# Patient Record
Sex: Male | Born: 1975 | Race: White | Hispanic: No | Marital: Married | Smoking: Current every day smoker
Health system: Southern US, Community
[De-identification: ages and names within clinical notes are randomized; demographics above are authoritative.]

## PROBLEM LIST (undated history)

## (undated) HISTORY — PX: HERNIA REPAIR: SHX51

---

## 2000-04-23 ENCOUNTER — Emergency Department (HOSPITAL_COMMUNITY): Admission: EM | Admit: 2000-04-23 | Discharge: 2000-04-23 | Payer: Self-pay

## 2000-12-15 ENCOUNTER — Emergency Department (HOSPITAL_COMMUNITY): Admission: EM | Admit: 2000-12-15 | Discharge: 2000-12-16 | Payer: Self-pay | Admitting: Emergency Medicine

## 2001-11-05 ENCOUNTER — Emergency Department (HOSPITAL_COMMUNITY): Admission: EM | Admit: 2001-11-05 | Discharge: 2001-11-05 | Payer: Self-pay | Admitting: Emergency Medicine

## 2006-04-06 ENCOUNTER — Emergency Department (HOSPITAL_COMMUNITY): Admission: EM | Admit: 2006-04-06 | Discharge: 2006-04-06 | Payer: Self-pay | Admitting: Emergency Medicine

## 2009-11-26 ENCOUNTER — Ambulatory Visit: Payer: Self-pay | Admitting: Diagnostic Radiology

## 2009-11-26 ENCOUNTER — Emergency Department (HOSPITAL_BASED_OUTPATIENT_CLINIC_OR_DEPARTMENT_OTHER): Admission: EM | Admit: 2009-11-26 | Discharge: 2009-11-26 | Payer: Self-pay | Admitting: Emergency Medicine

## 2014-04-16 ENCOUNTER — Encounter (HOSPITAL_BASED_OUTPATIENT_CLINIC_OR_DEPARTMENT_OTHER): Payer: Self-pay | Admitting: Emergency Medicine

## 2014-04-16 ENCOUNTER — Emergency Department (HOSPITAL_BASED_OUTPATIENT_CLINIC_OR_DEPARTMENT_OTHER): Payer: Self-pay

## 2014-04-16 ENCOUNTER — Inpatient Hospital Stay (HOSPITAL_BASED_OUTPATIENT_CLINIC_OR_DEPARTMENT_OTHER)
Admission: EM | Admit: 2014-04-16 | Discharge: 2014-04-19 | DRG: 872 | Disposition: A | Discharge status: Home / Self Care | Payer: Self-pay | Attending: Internal Medicine | Admitting: Internal Medicine

## 2014-04-16 DIAGNOSIS — R509 Fever, unspecified: Secondary | ICD-10-CM | POA: Diagnosis present

## 2014-04-16 DIAGNOSIS — IMO0002 Reserved for concepts with insufficient information to code with codable children: Secondary | ICD-10-CM | POA: Diagnosis present

## 2014-04-16 DIAGNOSIS — L0390 Cellulitis, unspecified: Secondary | ICD-10-CM

## 2014-04-16 DIAGNOSIS — F1911 Other psychoactive substance abuse, in remission: Secondary | ICD-10-CM | POA: Diagnosis present

## 2014-04-16 DIAGNOSIS — M25529 Pain in unspecified elbow: Secondary | ICD-10-CM | POA: Diagnosis present

## 2014-04-16 DIAGNOSIS — F17200 Nicotine dependence, unspecified, uncomplicated: Secondary | ICD-10-CM | POA: Diagnosis present

## 2014-04-16 DIAGNOSIS — D72829 Elevated white blood cell count, unspecified: Secondary | ICD-10-CM | POA: Diagnosis present

## 2014-04-16 DIAGNOSIS — M25521 Pain in right elbow: Secondary | ICD-10-CM

## 2014-04-16 DIAGNOSIS — L0291 Cutaneous abscess, unspecified: Secondary | ICD-10-CM | POA: Diagnosis present

## 2014-04-16 DIAGNOSIS — A419 Sepsis, unspecified organism: Principal | ICD-10-CM | POA: Diagnosis present

## 2014-04-16 DIAGNOSIS — R6520 Severe sepsis without septic shock: Secondary | ICD-10-CM

## 2014-04-16 LAB — CBC WITH DIFFERENTIAL/PLATELET
Basophils Absolute: 0 10*3/uL (ref 0.0–0.1)
Basophils Relative: 0 % (ref 0–1)
Eosinophils Absolute: 0.1 10*3/uL (ref 0.0–0.7)
Eosinophils Relative: 0 % (ref 0–5)
HEMATOCRIT: 36.8 % — AB (ref 39.0–52.0)
Hemoglobin: 12.7 g/dL — ABNORMAL LOW (ref 13.0–17.0)
LYMPHS ABS: 1.5 10*3/uL (ref 0.7–4.0)
LYMPHS PCT: 12 % (ref 12–46)
MCH: 28.7 pg (ref 26.0–34.0)
MCHC: 34.5 g/dL (ref 30.0–36.0)
MCV: 83.3 fL (ref 78.0–100.0)
Monocytes Absolute: 1.2 10*3/uL — ABNORMAL HIGH (ref 0.1–1.0)
Monocytes Relative: 9 % (ref 3–12)
NEUTROS ABS: 10.5 10*3/uL — AB (ref 1.7–7.7)
Neutrophils Relative %: 79 % — ABNORMAL HIGH (ref 43–77)
PLATELETS: 210 10*3/uL (ref 150–400)
RBC: 4.42 MIL/uL (ref 4.22–5.81)
RDW: 14.7 % (ref 11.5–15.5)
WBC: 13.3 10*3/uL — ABNORMAL HIGH (ref 4.0–10.5)

## 2014-04-16 LAB — BASIC METABOLIC PANEL
ANION GAP: 12 (ref 5–15)
BUN: 13 mg/dL (ref 6–23)
CHLORIDE: 97 meq/L (ref 96–112)
CO2: 28 meq/L (ref 19–32)
CREATININE: 1 mg/dL (ref 0.50–1.35)
Calcium: 9.5 mg/dL (ref 8.4–10.5)
GFR calc Af Amer: >90 mL/min (ref >90)
GLUCOSE: 94 mg/dL (ref 70–99)
Potassium: 4 mEq/L (ref 3.7–5.3)
Sodium: 137 mEq/L (ref 137–147)

## 2014-04-16 MED ORDER — VANCOMYCIN HCL IN DEXTROSE 1-5 GM/200ML-% IV SOLN
1000.0000 mg | Freq: Once | INTRAVENOUS | Status: AC
  Administered 2014-04-16: 1000 mg via INTRAVENOUS
  Filled 2014-04-16: qty 200

## 2014-04-16 MED ORDER — FENTANYL CITRATE 0.05 MG/ML IJ SOLN
100.0000 ug | Freq: Once | INTRAMUSCULAR | Status: AC
  Administered 2014-04-16: 100 ug via INTRAVENOUS
  Filled 2014-04-16: qty 2

## 2014-04-16 NOTE — ED Notes (Signed)
Pt states that his current home has lots of fleas and bed bugs, which he contributes to the scabs and puncture marks on his hands and upper arms, denies iv drug use

## 2014-04-16 NOTE — ED Provider Notes (Signed)
CSN: [id number]     Arrival date & time 04/16/14  2224 History  This chart was scribed for Hanley SeamenJohn L Tiare Rohlman, MD by Nicholos Johnsenise Iheanachor, ED scribe. This patient was seen in room MH01/MH01 and the patient's care was started at 10:53 PM.    Chief Complaint  Patient presents with  . Abscess   The history is provided by the patient. No language interpreter was used.   HPI Comments: Marcus Rice is a 38 y.o. male who presents to the Emergency Department with a gradually worsening, painful, red, warm, draining abscess to right upper arm; onset a few weeks ago. Noted subjective fever for the past 2 days. States it "busted" 2 on its own and the hole in the center has widened. Has been squeezing and draining the area; states every time he does it increases in size. Has been using hydrogen peroxide to clean it. Reports abscesses in the past that have self resolved. Denies illicit IV drug usage. Pain in severe at its worse, exacerbated by movement or palpation.  History reviewed. No pertinent past medical history. Past Surgical History  Procedure Laterality Date  . Hernia repair     No family history on file. History  Substance Use Topics  . Smoking status: Current Every Day Smoker -- 0.50 packs/day    Types: Cigarettes  . Smokeless tobacco: Not on file  . Alcohol Use: No    Review of Systems  Skin: Positive for wound.  All other systems reviewed and are negative.  Allergies  Review of patient's allergies indicates no known allergies.  Home Medications   Prior to Admission medications   Not on File   Triage vitals: BP 151/81  Pulse 125  Temp(Src) 100.2 F (37.9 C) (Oral)  Resp 20  Ht 5\' 11"  (1.803 m)  Wt 155 lb (70.308 kg)  BMI 21.63 kg/m2  SpO2 100%  Physical Exam General: Well-developed, well-nourished male in no acute distress; appearance consistent with age of record HENT: normocephalic; atraumatic Eyes: pupils equal, round and reactive to light; extraocular muscles intact Neck:  supple Heart: regular rate and rhythm; no murmurs Lungs: clear to auscultation bilaterally Abdomen: soft; nondistended; nontender; no masses or hepatosplenomegaly; bowel sounds present Extremities: No deformity; full range of motion; pulses normal Neurologic: Awake, alert and oriented; motor function intact in all extremities and symmetric; no facial droop Skin: Warm and dry. Draining abscess of the right upper arm with erythematous, tender, indurated tissue proximally.  Psychiatric: Normal mood and affect      ED Course  Procedures (including critical care time) DIAGNOSTIC STUDIES: Oxygen Saturation is 100% on room air, normal by my interpretation.    COORDINATION OF CARE: At 10:56 PM: Discussed treatment plan with patient which includes bedside ultrasound, IV fluids, and blood work. Patient agrees.    Pus from draining abscess sent for culture.  Vancomycin and Zosyn administered in the ED.  Dr. Allie Bossierhris Blackman will see in orthopedic surgical consultation later this morning. Dr[facility] Welton Fl[facility]Khan [facility]  CRITICAL CARE Performed by: Paula LibraMOLPUS,Clinton Wahlberg L Total critical care time: 35 minutes Critical care time was exclusive of separately billable procedures and treating other patients. Critical care was necessary to treat or prevent imminent or life-threatening deterioration. Critical care was time spent personally by me on the following activities: development of treatment plan with patient and/or surrogate as well as nursing, discussions with consultants, evaluation of patient's response to treatment, examination of patient, obtaining history from patient or surrogate, ordering  and performing treatments and interventions, ordering and review of laboratory studies, ordering and review of radiographic studies, pulse oximetry and re-evaluation of patient's condition.   MDM   Nursing notes and vitals signs, including pulse oximetry, reviewed.  Summary of this  visit's results, reviewed by myself:  Labs:  Results for orders placed during the hospital encounter of 04/16/14 (from the past 24 hour(s))  CBC WITH DIFFERENTIAL     Status: Abnormal   Collection Time    04/16/14 11:15 PM      Result Value Ref Range   WBC 13.3 (*) 4.0 - 10.5 K/uL   RBC 4.42  4.22 - 5.81 MIL/uL   Hemoglobin 12.7 (*) 13.0 - 17.0 g/dL   HCT 16.1 (*) 09.6 - 04.5 %   MCV 83.3  78.0 - 100.0 fL   MCH 28.7  26.0 - 34.0 pg   MCHC 34.5  30.0 - 36.0 g/dL   RDW 40.9  81.1 - 91.4 %   Platelets 210  150 - 400 K/uL   Neutrophils Relative % 79 (*) 43 - 77 %   Neutro Abs 10.5 (*) 1.7 - 7.7 K/uL   Lymphocytes Relative 12  12 - 46 %   Lymphs Abs 1.5  0.7 - 4.0 K/uL   Monocytes Relative 9  3 - 12 %   Monocytes Absolute 1.2 (*) 0.1 - 1.0 K/uL   Eosinophils Relative 0  0 - 5 %   Eosinophils Absolute 0.1  0.0 - 0.7 K/uL   Basophils Relative 0  0 - 1 %   Basophils Absolute 0.0  0.0 - 0.1 K/uL  BASIC METABOLIC PANEL     Status: None   Collection Time    04/16/14 11:15 PM      Result Value Ref Range   Sodium 137  137 - 147 mEq/L   Potassium 4.0  3.7 - 5.3 mEq/L   Chloride 97  96 - 112 mEq/L   CO2 28  19 - 32 mEq/L   Glucose, Bld 94  70 - 99 mg/dL   BUN 13  6 - 23 mg/dL   Creatinine, Ser 7.82  0.50 - 1.35 mg/dL   Calcium 9.5  8.4 - 95.6 mg/dL   GFR calc non Af Amer >90  >90 mL/min   GFR calc Af Amer >90  >90 mL/min   Anion gap 12  5 - 15  I-STAT CG4 LACTIC ACID, ED     Status: None   Collection Time    04/30/14 12:55 AM      Result Value Ref Range   Lactic Acid, Venous 0.78  0.5 - 2.2 mmol/L    Imaging Studies: [place] Extrem Up Right Ltd  04/30/14   CLINICAL DATA:  Draining wound medial right upper arm.  EXAM: ULTRASOUND RIGHT UPPER EXTREMITY LIMITED  TECHNIQUE: Ultrasound examination of the upper extremity soft tissues was performed in the area of clinical concern.  COMPARISON:  None.  FINDINGS: There is a complex fluid collection in the area of concern which is ill-defined  with intervening foci of increased echogenicity and surrounding hyperemia. This fluid collection measures approximately 7.5 x 1.4 x 4.6 cm.  IMPRESSION: Complex fluid collection medially in the soft tissues of the right upper arm suspicious for ill-defined abscess.   Electronically Signed   By: Roxy Horseman M.D.   On: 04/30/2014 00:06    Final diagnoses:  Cellulitis and abscess    I personally performed the services described in this documentation, which was scribed  in my presence. The recorded information has been reviewed and is accurate.     Hanley Seamen, MD 04/17/14 [phone]

## 2014-04-16 NOTE — ED Notes (Signed)
MD at bedside. 

## 2014-04-16 NOTE — ED Notes (Signed)
Abscess to his right upper arm, painful, red, hot to touch and swollen for a week.

## 2014-04-17 ENCOUNTER — Encounter (HOSPITAL_COMMUNITY): Payer: Self-pay | Admitting: Internal Medicine

## 2014-04-17 ENCOUNTER — Inpatient Hospital Stay (HOSPITAL_COMMUNITY): Payer: Self-pay | Admitting: Anesthesiology

## 2014-04-17 ENCOUNTER — Encounter (HOSPITAL_COMMUNITY): Payer: Self-pay | Admitting: Anesthesiology

## 2014-04-17 ENCOUNTER — Encounter (HOSPITAL_COMMUNITY)
Admission: EM | Disposition: A | Discharge status: Home / Self Care | Payer: Self-pay | Source: Home / Self Care | Attending: Internal Medicine

## 2014-04-17 DIAGNOSIS — R6520 Severe sepsis without septic shock: Secondary | ICD-10-CM

## 2014-04-17 DIAGNOSIS — L0291 Cutaneous abscess, unspecified: Secondary | ICD-10-CM | POA: Diagnosis present

## 2014-04-17 DIAGNOSIS — M25529 Pain in unspecified elbow: Secondary | ICD-10-CM | POA: Diagnosis present

## 2014-04-17 DIAGNOSIS — A419 Sepsis, unspecified organism: Secondary | ICD-10-CM

## 2014-04-17 DIAGNOSIS — D72829 Elevated white blood cell count, unspecified: Secondary | ICD-10-CM

## 2014-04-17 DIAGNOSIS — R509 Fever, unspecified: Secondary | ICD-10-CM

## 2014-04-17 DIAGNOSIS — L0390 Cellulitis, unspecified: Secondary | ICD-10-CM

## 2014-04-17 HISTORY — PX: I&D EXTREMITY: SHX5045

## 2014-04-17 LAB — BASIC METABOLIC PANEL
ANION GAP: 12 (ref 5–15)
BUN: 10 mg/dL (ref 6–23)
CALCIUM: 9.2 mg/dL (ref 8.4–10.5)
CO2: 26 meq/L (ref 19–32)
Chloride: 98 mEq/L (ref 96–112)
Creatinine, Ser: 0.99 mg/dL (ref 0.50–1.35)
GFR calc Af Amer: >90 mL/min (ref >90)
GFR calc non Af Amer: >90 mL/min (ref >90)
Glucose, Bld: 107 mg/dL — ABNORMAL HIGH (ref 70–99)
Potassium: 3.9 mEq/L (ref 3.7–5.3)
SODIUM: 136 meq/L — AB (ref 137–147)

## 2014-04-17 LAB — CBC
HCT: 37.4 % — ABNORMAL LOW (ref 39.0–52.0)
Hemoglobin: 13 g/dL (ref 13.0–17.0)
MCH: 28.4 pg (ref 26.0–34.0)
MCHC: 34.8 g/dL (ref 30.0–36.0)
MCV: 81.7 fL (ref 78.0–100.0)
Platelets: 231 10*3/uL (ref 150–400)
RBC: 4.58 MIL/uL (ref 4.22–5.81)
RDW: 14.7 % (ref 11.5–15.5)
WBC: 13.9 10*3/uL — ABNORMAL HIGH (ref 4.0–10.5)

## 2014-04-17 LAB — I-STAT CG4 LACTIC ACID, ED: LACTIC ACID, VENOUS: 0.78 mmol/L (ref 0.5–2.2)

## 2014-04-17 SURGERY — IRRIGATION AND DEBRIDEMENT EXTREMITY
Anesthesia: General | Site: Arm Upper | Laterality: Right

## 2014-04-17 MED ORDER — OXYCODONE HCL 5 MG PO TABS: 5.0000 mg | ORAL_TABLET | ORAL | Status: DC | PRN

## 2014-04-17 MED ORDER — MIDAZOLAM HCL 2 MG/2ML IJ SOLN
INTRAMUSCULAR | Status: AC
  Filled 2014-04-17: qty 2

## 2014-04-17 MED ORDER — LIDOCAINE HCL (CARDIAC) 20 MG/ML IV SOLN
INTRAVENOUS | Status: AC
  Filled 2014-04-17: qty 5

## 2014-04-17 MED ORDER — HYDROMORPHONE HCL PF 1 MG/ML IJ SOLN
0.2500 mg | INTRAMUSCULAR | Status: DC | PRN
Start: 2014-04-17 — End: 2014-04-17
  Administered 2014-04-17 (×4): 0.5 mg via INTRAVENOUS

## 2014-04-17 MED ORDER — 0.9 % SODIUM CHLORIDE (POUR BTL) OPTIME
TOPICAL | Status: DC | PRN
  Administered 2014-04-17: 1000 mL

## 2014-04-17 MED ORDER — PIPERACILLIN-TAZOBACTAM 3.375 G IVPB 30 MIN
3.3750 g | Freq: Once | INTRAVENOUS | Status: AC
  Administered 2014-04-17: 3.375 g via INTRAVENOUS
  Filled 2014-04-17 (×2): qty 50

## 2014-04-17 MED ORDER — ONDANSETRON HCL 4 MG/2ML IJ SOLN
INTRAMUSCULAR | Status: AC
  Filled 2014-04-17: qty 2

## 2014-04-17 MED ORDER — HYDROMORPHONE HCL PF 1 MG/ML IJ SOLN
INTRAMUSCULAR | Status: AC
  Filled 2014-04-17: qty 1

## 2014-04-17 MED ORDER — FENTANYL CITRATE 0.05 MG/ML IJ SOLN
100.0000 ug | Freq: Once | INTRAMUSCULAR | Status: AC
  Administered 2014-04-17: 100 ug via INTRAVENOUS

## 2014-04-17 MED ORDER — FENTANYL CITRATE 0.05 MG/ML IJ SOLN
INTRAMUSCULAR | Status: AC
  Filled 2014-04-17: qty 5

## 2014-04-17 MED ORDER — HYDROCODONE-ACETAMINOPHEN 5-325 MG PO TABS
1.0000 | ORAL_TABLET | ORAL | Status: DC | PRN
  Administered 2014-04-17 – 2014-04-18 (×2): 2 via ORAL
  Administered 2014-04-18: 1 via ORAL
  Administered 2014-04-19 (×2): 2 via ORAL
  Filled 2014-04-17: qty 2
  Filled 2014-04-17: qty 1
  Filled 2014-04-17 (×3): qty 2

## 2014-04-17 MED ORDER — FENTANYL CITRATE 0.05 MG/ML IJ SOLN
INTRAMUSCULAR | Status: AC
  Filled 2014-04-17: qty 2

## 2014-04-17 MED ORDER — ONDANSETRON HCL 4 MG PO TABS: 4.0000 mg | ORAL_TABLET | Freq: Four times a day (QID) | ORAL | Status: DC | PRN

## 2014-04-17 MED ORDER — LACTATED RINGERS IV SOLN
INTRAVENOUS | Status: DC | PRN
  Administered 2014-04-17: 21:00:00 via INTRAVENOUS

## 2014-04-17 MED ORDER — METHOCARBAMOL 1000 MG/10ML IJ SOLN
500.0000 mg | Freq: Four times a day (QID) | INTRAVENOUS | Status: DC | PRN
  Filled 2014-04-17: qty 5

## 2014-04-17 MED ORDER — PROPOFOL 10 MG/ML IV BOLUS
INTRAVENOUS | Status: AC
  Filled 2014-04-17: qty 20

## 2014-04-17 MED ORDER — IBUPROFEN 800 MG PO TABS: 800.0000 mg | ORAL_TABLET | Freq: Four times a day (QID) | ORAL | Status: DC | PRN

## 2014-04-17 MED ORDER — ONDANSETRON HCL 4 MG/2ML IJ SOLN
4.0000 mg | Freq: Three times a day (TID) | INTRAMUSCULAR | Status: DC | PRN
  Administered 2014-04-17: 4 mg via INTRAVENOUS
  Filled 2014-04-17: qty 2

## 2014-04-17 MED ORDER — PIPERACILLIN-TAZOBACTAM 3.375 G IVPB
3.3750 g | Freq: Three times a day (TID) | INTRAVENOUS | Status: DC
  Administered 2014-04-17 – 2014-04-19 (×6): 3.375 g via INTRAVENOUS
  Filled 2014-04-17 (×8): qty 50

## 2014-04-17 MED ORDER — ACETAMINOPHEN 650 MG RE SUPP: 650.0000 mg | Freq: Four times a day (QID) | RECTAL | Status: DC | PRN

## 2014-04-17 MED ORDER — ACETAMINOPHEN 325 MG PO TABS
650.0000 mg | ORAL_TABLET | Freq: Four times a day (QID) | ORAL | Status: DC | PRN
  Administered 2014-04-17: 650 mg via ORAL
  Filled 2014-04-17: qty 2

## 2014-04-17 MED ORDER — OXYCODONE-ACETAMINOPHEN 5-325 MG PO TABS
1.0000 | ORAL_TABLET | ORAL | Status: DC | PRN
  Administered 2014-04-18: 2 via ORAL
  Filled 2014-04-17: qty 2

## 2014-04-17 MED ORDER — LIDOCAINE HCL (CARDIAC) 20 MG/ML IV SOLN
INTRAVENOUS | Status: DC | PRN
  Administered 2014-04-17: 100 mg via INTRAVENOUS

## 2014-04-17 MED ORDER — SODIUM CHLORIDE 0.9 % IV SOLN: INTRAVENOUS | Status: DC

## 2014-04-17 MED ORDER — ACETAMINOPHEN 325 MG PO TABS
650.0000 mg | ORAL_TABLET | Freq: Once | ORAL | Status: AC
  Administered 2014-04-17: 650 mg via ORAL
  Filled 2014-04-17: qty 2

## 2014-04-17 MED ORDER — METOCLOPRAMIDE HCL 5 MG/ML IJ SOLN: 5.0000 mg | Freq: Three times a day (TID) | INTRAMUSCULAR | Status: DC | PRN

## 2014-04-17 MED ORDER — METHOCARBAMOL 500 MG PO TABS: 500.0000 mg | ORAL_TABLET | Freq: Four times a day (QID) | ORAL | Status: DC | PRN

## 2014-04-17 MED ORDER — HYDROMORPHONE HCL PF 1 MG/ML IJ SOLN
1.0000 mg | INTRAMUSCULAR | Status: DC | PRN
  Administered 2014-04-17 (×2): 1 mg via INTRAVENOUS
  Filled 2014-04-17 (×2): qty 1

## 2014-04-17 MED ORDER — PROMETHAZINE HCL 25 MG/ML IJ SOLN: 6.2500 mg | INTRAMUSCULAR | Status: DC | PRN

## 2014-04-17 MED ORDER — VANCOMYCIN HCL IN DEXTROSE 1-5 GM/200ML-% IV SOLN
1000.0000 mg | Freq: Three times a day (TID) | INTRAVENOUS | Status: DC
  Administered 2014-04-17 – 2014-04-19 (×7): 1000 mg via INTRAVENOUS
  Filled 2014-04-17 (×8): qty 200

## 2014-04-17 MED ORDER — ONDANSETRON HCL 4 MG/2ML IJ SOLN: 4.0000 mg | Freq: Four times a day (QID) | INTRAMUSCULAR | Status: DC | PRN

## 2014-04-17 MED ORDER — MORPHINE SULFATE 2 MG/ML IJ SOLN
1.0000 mg | INTRAMUSCULAR | Status: DC | PRN
  Administered 2014-04-18 (×2): 1 mg via INTRAVENOUS
  Filled 2014-04-17 (×2): qty 1

## 2014-04-17 MED ORDER — MIDAZOLAM HCL 5 MG/5ML IJ SOLN
INTRAMUSCULAR | Status: DC | PRN
  Administered 2014-04-17: 2 mg via INTRAVENOUS

## 2014-04-17 MED ORDER — GABAPENTIN 300 MG PO CAPS
300.0000 mg | ORAL_CAPSULE | Freq: Three times a day (TID) | ORAL | Status: DC | PRN
  Filled 2014-04-17: qty 1

## 2014-04-17 MED ORDER — DIPHENHYDRAMINE HCL 12.5 MG/5ML PO ELIX
12.5000 mg | ORAL_SOLUTION | ORAL | Status: DC | PRN
  Administered 2014-04-18: 25 mg via ORAL
  Filled 2014-04-17: qty 10

## 2014-04-17 MED ORDER — FENTANYL CITRATE 0.05 MG/ML IJ SOLN
INTRAMUSCULAR | Status: DC | PRN
  Administered 2014-04-17: 50 ug via INTRAVENOUS
  Administered 2014-04-17: 25 ug via INTRAVENOUS
  Administered 2014-04-17: 100 ug via INTRAVENOUS
  Administered 2014-04-17: 25 ug via INTRAVENOUS
  Administered 2014-04-17: 50 ug via INTRAVENOUS

## 2014-04-17 MED ORDER — FENTANYL CITRATE 0.05 MG/ML IJ SOLN
INTRAMUSCULAR | Status: AC
  Administered 2014-04-17: 100 ug via INTRAVENOUS
  Filled 2014-04-17: qty 2

## 2014-04-17 MED ORDER — FENTANYL CITRATE 0.05 MG/ML IJ SOLN
25.0000 ug | INTRAMUSCULAR | Status: DC | PRN
  Administered 2014-04-17 (×2): 50 ug via INTRAVENOUS

## 2014-04-17 MED ORDER — ENOXAPARIN SODIUM 40 MG/0.4ML ~~LOC~~ SOLN
40.0000 mg | SUBCUTANEOUS | Status: DC
  Administered 2014-04-18: 40 mg via SUBCUTANEOUS
  Filled 2014-04-17 (×3): qty 0.4

## 2014-04-17 MED ORDER — SODIUM CHLORIDE 0.9 % IV SOLN
INTRAVENOUS | Status: DC
  Administered 2014-04-17 – 2014-04-18 (×2): via INTRAVENOUS

## 2014-04-17 MED ORDER — HYDROCODONE-ACETAMINOPHEN 5-325 MG PO TABS
ORAL_TABLET | ORAL | Status: AC
  Filled 2014-04-17: qty 2

## 2014-04-17 MED ORDER — SODIUM CHLORIDE 0.9 % IR SOLN
Status: DC | PRN
  Administered 2014-04-17: 3000 mL

## 2014-04-17 MED ORDER — METOCLOPRAMIDE HCL 10 MG PO TABS: 5.0000 mg | ORAL_TABLET | Freq: Three times a day (TID) | ORAL | Status: DC | PRN

## 2014-04-17 MED ORDER — KETOROLAC TROMETHAMINE 15 MG/ML IJ SOLN
30.0000 mg | Freq: Four times a day (QID) | INTRAMUSCULAR | Status: DC | PRN
  Administered 2014-04-17 (×2): 30 mg via INTRAVENOUS
  Filled 2014-04-17: qty 2
  Filled 2014-04-17: qty 1
  Filled 2014-04-17 (×2): qty 2

## 2014-04-17 MED ORDER — PROPOFOL 10 MG/ML IV BOLUS
INTRAVENOUS | Status: DC | PRN
  Administered 2014-04-17: 200 mg via INTRAVENOUS
  Administered 2014-04-17: 50 mg via INTRAVENOUS

## 2014-04-17 SURGICAL SUPPLY — 46 items
BAG ZIPLOCK 12X15 (MISCELLANEOUS) IMPLANT
BANDAGE ELASTIC 4 VELCRO ST LF (GAUZE/BANDAGES/DRESSINGS) ×3 IMPLANT
BLADE SURG SZ10 CARB STEEL (BLADE) IMPLANT
BNDG GAUZE ELAST 4 BULKY (GAUZE/BANDAGES/DRESSINGS) IMPLANT
CUFF TOURN SGL QUICK 18 (TOURNIQUET CUFF) IMPLANT
DRAIN PENROSE 18X1/2 LTX STRL (DRAIN) IMPLANT
DRAPE LG THREE QUARTER DISP (DRAPES) ×3 IMPLANT
DRAPE SURG 17X11 SM STRL (DRAPES) IMPLANT
DRAPE U-SHAPE 47X51 STRL (DRAPES) ×3 IMPLANT
DRSG PAD ABDOMINAL 8X10 ST (GAUZE/BANDAGES/DRESSINGS) IMPLANT
DRSG TEGADERM 4X4.75 (GAUZE/BANDAGES/DRESSINGS) ×9 IMPLANT
ELECT REM PT RETURN 9FT ADLT (ELECTROSURGICAL) ×3
ELECTRODE REM PT RTRN 9FT ADLT (ELECTROSURGICAL) ×1 IMPLANT
GAUZE SPONGE 4X4 12PLY STRL (GAUZE/BANDAGES/DRESSINGS) ×2 IMPLANT
GAUZE XEROFORM 1X8 LF (GAUZE/BANDAGES/DRESSINGS) ×3 IMPLANT
GLOVE BIO SURGEON STRL SZ8 (GLOVE) ×6 IMPLANT
GLOVE BIOGEL PI IND STRL 7.5 (GLOVE) ×1 IMPLANT
GLOVE BIOGEL PI IND STRL 8 (GLOVE) ×2 IMPLANT
GLOVE BIOGEL PI INDICATOR 7.5 (GLOVE) ×2
GLOVE BIOGEL PI INDICATOR 8 (GLOVE) ×4
GLOVE SURG SS PI 7.5 STRL IVOR (GLOVE) ×3 IMPLANT
GOWN STRL REUS W/TWL XL LVL3 (GOWN DISPOSABLE) ×9 IMPLANT
HANDPIECE INTERPULSE COAX TIP (DISPOSABLE) ×2
IV NS IRRIG 3000ML ARTHROMATIC (IV SOLUTION) ×3 IMPLANT
KIT BASIN OR (CUSTOM PROCEDURE TRAY) ×3 IMPLANT
MANIFOLD NEPTUNE II (INSTRUMENTS) ×3 IMPLANT
NS IRRIG 1000ML POUR BTL (IV SOLUTION) ×3 IMPLANT
PACK LOWER EXTREMITY WL (CUSTOM PROCEDURE TRAY) ×3 IMPLANT
PAD CAST 4YDX4 CTTN HI CHSV (CAST SUPPLIES) IMPLANT
PADDING CAST COTTON 4X4 STRL (CAST SUPPLIES)
POSITIONER SURGICAL ARM (MISCELLANEOUS) ×3 IMPLANT
SET HNDPC FAN SPRY TIP SCT (DISPOSABLE) ×1 IMPLANT
SHIELD SPLASH 9X12 PIC/PGM (MISCELLANEOUS) ×6 IMPLANT
SOL PREP POV-IOD 4OZ 10% (MISCELLANEOUS) ×3 IMPLANT
SOL PREP PROV IODINE SCRUB 4OZ (MISCELLANEOUS) IMPLANT
SPONGE GAUZE 4X4 12PLY (GAUZE/BANDAGES/DRESSINGS) ×3 IMPLANT
SUT ETHILON 2 0 PS N (SUTURE) ×3 IMPLANT
SUT ETHILON 3 0 PS 1 (SUTURE) ×3 IMPLANT
SUT PROLENE 3 0 PS 2 (SUTURE) ×3 IMPLANT
SUT VIC AB 1 CT1 27 (SUTURE)
SUT VIC AB 1 CT1 27XBRD ANTBC (SUTURE) IMPLANT
SUT VIC AB 2-0 CT1 27 (SUTURE)
SUT VIC AB 2-0 CT1 27XBRD (SUTURE) IMPLANT
SYR CONTROL 10ML LL (SYRINGE) IMPLANT
SYRINGE 20CC LL (MISCELLANEOUS) IMPLANT
TOWEL OR 17X26 10 PK STRL BLUE (TOWEL DISPOSABLE) ×3 IMPLANT

## 2014-04-17 NOTE — Transfer of Care (Signed)
Immediate Anesthesia Transfer of Care Note  Patient: Marcus [name]  Procedure(s) Performed: Procedure(s): IRRIGATION AND DEBRIDEMENT RIGHT ARM (Right)  Patient Location: PACU  Anesthesia Type:General  Level of Consciousness: awake, alert , oriented and patient cooperative  Airway & Oxygen Therapy: Patient Spontanous Breathing and Patient connected to face mask oxygen  Post-op Assessment: Report given to PACU RN, Post -op Vital signs reviewed and stable and Patient moving all extremities  Post vital signs: Reviewed and stable  Complications: No apparent anesthesia complications

## 2014-04-17 NOTE — H&P (Signed)
Marcus [name] is an 38 y.o. male.   Chief Complaint: Fevers, chills, rt arm pain, erythema. 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  He presents with complaints of pain, erythema, and induration of his rt upper extremity, Rt anterior chest wall, and rt neck.  He states that the problem started about 2 weeks ago. He states that when he first noticed the original lesion then it was about the size of a dime and did not seem especially painful or hot.  He states that the lesion stayed about the same for the next week and a half. Then in the last 5 days the area became larger, more erythematous and became tense.  The patient ultimately punctured it and tried to drain it.  When he begain this he noticed that the pus just kept coming back and the area of erythema grew.  On Monday it extended up on top of his shoulder, around to his rt anterior chest and up his neck.  His pain became excrutiating. He states that he has had fevers and chills for the last 3 days.  History reviewed. No pertinent past medical history.  Past Surgical History  Procedure Laterality Date  . Hernia repair      No family history on file. Social History:  reports that he has been smoking Cigarettes.  He has been smoking about 0.50 packs per day. He does not have any smokeless tobacco history on file. He reports that he does not drink alcohol or use illicit drugs.  Allergies: No Known Allergies  No prescriptions prior to admission    Results for orders placed during the hospital encounter of 04/16/14 (from the past 48 hour(s))  CBC WITH DIFFERENTIAL     Status: Abnormal   Collection Time    04/16/14 11:15 PM      Result Value Ref Range   WBC 13.3 (*) 4.0 - 10.5 K/uL   RBC 4.42  4.22 - 5.81 MIL/uL   Hemoglobin 12.7 (*) 13.0 - 17.0 g/dL   HCT 36.8 (*) 39.0 - 52.0 %   MCV 83.3  78.0 - 100.0 fL   MCH 28.7  26.0 - 34.0 pg   MCHC 34.5  30.0 - 36.0 g/dL   RDW 14.7  11.5 - 15.5 %   Platelets 210   150 - 400 K/uL   Neutrophils Relative % 79 (*) 43 - 77 %   Neutro Abs 10.5 (*) 1.7 - 7.7 K/uL   Lymphocytes Relative 12  12 - 46 %   Lymphs Abs 1.5  0.7 - 4.0 K/uL   Monocytes Relative 9  3 - 12 %   Monocytes Absolute 1.2 (*) 0.1 - 1.0 K/uL   Eosinophils Relative 0  0 - 5 %   Eosinophils Absolute 0.1  0.0 - 0.7 K/uL   Basophils Relative 0  0 - 1 %   Basophils Absolute 0.0  0.0 - 0.1 K/uL  BASIC METABOLIC PANEL     Status: None   Collection Time    04/16/14 11:15 PM      Result Value Ref Range   Sodium 137  137 - 147 mEq/L   Potassium 4.0  3.7 - 5.3 mEq/L   Chloride 97  96 - 112 mEq/L   CO2 28  19 - 32 mEq/L   Glucose, Bld 94  70 - 99 mg/dL   BUN 13  6 - 23 mg/dL   Creatinine, Ser 1.00  0.50 - 1.35 mg/dL   Calcium 9.5  8.4 - 10.5 mg/dL   GFR calc non Af Amer >90  >90 mL/min   GFR calc Af Amer >90  >90 mL/min   Comment: (NOTE)     The eGFR has been calculated using the CKD EPI equation.     This calculation has not been validated in all clinical situations.     eGFR's persistently <90 mL/min signify possible Chronic Kidney     Disease.   Anion gap 12  5 - 15  I-STAT CG4 LACTIC ACID, ED     Status: None   Collection Time    04/17/14 12:55 AM      Result Value Ref Range   Lactic Acid, Venous 0.78  0.5 - 2.2 mmol/L   [place] Extrem Up Right Ltd  04/17/2014   CLINICAL DATA:  Draining wound medial right upper arm.  EXAM: ULTRASOUND RIGHT UPPER EXTREMITY LIMITED  TECHNIQUE: Ultrasound examination of the upper extremity soft tissues was performed in the area of clinical concern.  COMPARISON:  None.  FINDINGS: There is a complex fluid collection in the area of concern which is ill-defined with intervening foci of increased echogenicity and surrounding hyperemia. This fluid collection measures approximately 7.5 x 1.4 x 4.6 cm.  IMPRESSION: Complex fluid collection medially in the soft tissues of the right upper arm suspicious for ill-defined abscess.   Electronically Signed   By: Camie Patience  M.D.   On: 04/17/2014 00:06    Review of Systems  Constitutional: Positive for fever, chills and malaise/fatigue.       No appetite.  HENT: Negative for congestion and sore throat.   Eyes: Negative for blurred vision, double vision and redness.  Respiratory: Negative for cough, hemoptysis, sputum production and wheezing.   Cardiovascular: Negative for chest pain, palpitations, orthopnea and leg swelling.  Gastrointestinal: Negative for heartburn, nausea, vomiting, abdominal pain, diarrhea, constipation, blood in stool and melena.  Genitourinary: Negative for dysuria, urgency, frequency and hematuria.  Musculoskeletal: Positive for joint pain and neck pain.  Skin:       Erythema and induration of the right upper extremity, shoulder, neck and rt anterior chest wall.  Neurological: Negative for dizziness, tingling, sensory change, focal weakness and headaches.  Endo/Heme/Allergies: Negative for environmental allergies. Does not bruise/bleed easily.  Psychiatric/Behavioral: Positive for suicidal ideas. Negative for depression and substance abuse.    Blood pressure 124/38, pulse 74, temperature 98.3 F (36.8 C), temperature source Oral, resp. rate 18, height '5\' 10"'  (1.778 m), weight 70.308 kg (155 lb), SpO2 100.00%. Physical Exam  Constitutional: He is oriented to person, place, and time. He appears well-developed and well-nourished. No distress.  HENT:  Head: Normocephalic and atraumatic.  Mouth/Throat: Oropharynx is clear and moist.  Eyes: Conjunctivae and EOM are normal. Pupils are equal, round, and reactive to light. Right eye exhibits no discharge. Left eye exhibits no discharge. No scleral icterus.  Neck: Normal range of motion. No JVD present. No tracheal deviation present. No thyromegaly present.  Cardiovascular: Normal rate, normal heart sounds and intact distal pulses.  Exam reveals no gallop and no friction rub.   No murmur heard. Respiratory: No stridor. No respiratory distress.  He has no wheezes. He has no rales. He exhibits no tenderness.  GI: He exhibits no distension and no mass. There is no tenderness. There is no rebound and no guarding.  Musculoskeletal: He exhibits no edema and no tenderness.  Lymphadenopathy:    He has cervical adenopathy.  Neurological: He is alert and oriented to person, place, and time. He has normal reflexes. He displays normal reflexes. No cranial nerve deficit. He exhibits normal muscle tone.  Skin: Skin is warm and dry. He is not diaphoretic. There is erythema.  Erythema, induration, and warmth to rt upper extremity, shoulder, neck, and rt anterior chest wall.  Psychiatric: He has a normal mood and affect. His behavior is normal. Judgment and thought content normal.     Assessment/Plan 1, Abscess and cellulitis of the right upper extremity, neck, shoulder, and chest wall. IV antibiotics to cover for resistant organisms.  Pt also has had blood cultures drawn.  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  If blood cultures are positive he should have an echocardiogram perfomed to examine his valves for vegetations. 2. Fevers. - IV fluids and IV antibiotics 3. Rt arm pain - Pain control 4. Leukocytosis - IV antibiotics to cover for resistant organisms until results of blood and wound cultures are returned.  Ayush Boulet 04/17/2014, 3:34 AM

## 2014-04-17 NOTE — Progress Notes (Signed)
Nutrition Brief Note  Patient identified on the Malnutrition Screening Tool (MST) Report  Wt Readings from Last 15 Encounters:  04/17/14 155 lb (70.308 kg)  04/17/14 155 lb (70.308 kg)    Body mass index is 22.24 kg/(m^2). Patient meets criteria for Normal weight based on current BMI.   Current diet order is Regular, patient is consuming approximately 100% of meals at this time. Labs and medications reviewed.   Pt reported slight decrease in appetite for past 2-3 weeks d/t life stressors and moving situations. Diet recall indicates pt consuming two balanced meals daily. Endorsed small amount of weight loss, but states weight normally tends to fluctuate between 155-160 lbs. Currently eating well during admit >75%, and tolerating regular diet  No nutrition interventions warranted at this time. If nutrition issues arise, please consult RD.   Lloyd HugerSarah F Malessa Zartman MS RD LDN Clinical Dietitian Pager:[phone]

## 2014-04-17 NOTE — Consult Note (Signed)
Reason for Consult:  Right upper arm abscess Referring Physician:   EDP  CAUY [name] is an 38 y.o. male.  HPI:   38 yo male with a 2 week history of a draining wound on his right upper arm of uncertain etiology.  Denies any specific injury and denies recent IV drug use/abuse (last used 5 years ago).  The wound has been worsening over the past 24 hours.  Was seen at [facility] and transferred her for further treatment given a large fluid collection noted on ultrasound and an obvious abscess.  He understand fully that surgery is indicated given the nature of the drainage.  [facility] and [facility].  History reviewed. No pertinent past medical history.  Past Surgical History  Procedure Laterality Date  . Hernia repair      No family history on file.  Social History:  reports that he has been smoking Cigarettes.  He has been smoking about 0.50 packs per day. He does not have any smokeless tobacco history on file. He reports that he does not drink alcohol or use illicit drugs.  Allergies: No Known Allergies  Medications: I have reviewed the patient's current medications.  Results for orders placed during the hospital encounter of 04/16/14 (from the past 48 hour(s))  CBC WITH DIFFERENTIAL     Status: Abnormal   Collection Time    04/16/14 11:15 PM      Result Value Ref Range   WBC 13.3 (*) 4.0 - 10.5 K/uL   RBC 4.42  4.22 - 5.81 MIL/uL   Hemoglobin 12.7 (*) 13.0 - 17.0 g/dL   HCT 36.8 (*) 39.0 - 52.0 %   MCV 83.3  78.0 - 100.0 fL   MCH 28.7  26.0 - 34.0 pg   MCHC 34.5  30.0 - 36.0 g/dL   RDW 14.7  11.5 - 15.5 %   Platelets 210  150 - 400 K/uL   Neutrophils Relative % 79 (*) 43 - 77 %   Neutro Abs 10.5 (*) 1.7 - 7.7 K/uL   Lymphocytes Relative 12  12 - 46 %   Lymphs Abs 1.5  0.7 - 4.0 K/uL   Monocytes Relative 9  3 - 12 %   Monocytes Absolute 1.2 (*) 0.1 - 1.0 K/uL   Eosinophils Relative 0  0 - 5 %   Eosinophils Absolute 0.1   0.0 - 0.7 K/uL   Basophils Relative 0  0 - 1 %   Basophils Absolute 0.0  0.0 - 0.1 K/uL  BASIC METABOLIC PANEL     Status: None   Collection Time    04/16/14 11:15 PM      Result Value Ref Range   Sodium 137  137 - 147 mEq/L   Potassium 4.0  3.7 - 5.3 mEq/L   Chloride 97  96 - 112 mEq/L   CO2 28  19 - 32 mEq/L   Glucose, Bld 94  70 - 99 mg/dL   BUN 13  6 - 23 mg/dL   Creatinine, Ser 1.00  0.50 - 1.35 mg/dL   Calcium 9.5  8.4 - 10.5 mg/dL   GFR calc non Af Amer >90  >90 mL/min   GFR calc Af Amer >90  >90 mL/min   Comment: (NOTE)     The eGFR has been calculated using the CKD EPI equation.     This calculation has not been validated in all clinical situations.  eGFR's persistently <90 mL/min signify possible Chronic Kidney     Disease.   Anion gap 12  5 - 15  I-STAT CG4 LACTIC ACID, ED     Status: None   Collection Time    04/17/14 12:55 AM      Result Value Ref Range   Lactic Acid, Venous 0.78  0.5 - 2.2 mmol/L  CBC     Status: Abnormal   Collection Time    04/17/14  5:30 AM      Result Value Ref Range   WBC 13.9 (*) 4.0 - 10.5 K/uL   RBC 4.58  4.22 - 5.81 MIL/uL   Hemoglobin 13.0  13.0 - 17.0 g/dL   HCT 37.4 (*) 39.0 - 52.0 %   MCV 81.7  78.0 - 100.0 fL   MCH 28.4  26.0 - 34.0 pg   MCHC 34.8  30.0 - 36.0 g/dL   RDW 14.7  11.5 - 15.5 %   Platelets 231  150 - 400 K/uL  BASIC METABOLIC PANEL     Status: Abnormal   Collection Time    04/17/14  5:30 AM      Result Value Ref Range   Sodium 136 (*) 137 - 147 mEq/L   Potassium 3.9  3.7 - 5.3 mEq/L   Chloride 98  96 - 112 mEq/L   CO2 26  19 - 32 mEq/L   Glucose, Bld 107 (*) 70 - 99 mg/dL   BUN 10  6 - 23 mg/dL   Creatinine, Ser 0.99  0.50 - 1.35 mg/dL   Calcium 9.2  8.4 - 10.5 mg/dL   GFR calc non Af Amer >90  >90 mL/min   GFR calc Af Amer >90  >90 mL/min   Comment: (NOTE)     The eGFR has been calculated using the CKD EPI equation.     This calculation has not been validated in all clinical situations.     eGFR's  persistently <90 mL/min signify possible Chronic Kidney     Disease.   Anion gap 12  5 - 15    [place] Extrem Up Right Ltd  04/17/2014   CLINICAL DATA:  Draining wound medial right upper arm.  EXAM: ULTRASOUND RIGHT UPPER EXTREMITY LIMITED  TECHNIQUE: Ultrasound examination of the upper extremity soft tissues was performed in the area of clinical concern.  COMPARISON:  None.  FINDINGS: There is a complex fluid collection in the area of concern which is ill-defined with intervening foci of increased echogenicity and surrounding hyperemia. This fluid collection measures approximately 7.5 x 1.4 x 4.6 cm.  IMPRESSION: Complex fluid collection medially in the soft tissues of the right upper arm suspicious for ill-defined abscess.   Electronically Signed   By: Camie Patience M.D.   On: 04/17/2014 00:06    ROS Blood pressure 107/71, pulse 75, temperature 98.2 F (36.8 C), temperature source Oral, resp. rate 18, height '5\' 10"'  (1.778 m), weight 70.308 kg (155 lb), SpO2 100.00%. Physical Exam  Musculoskeletal:       Right upper arm: He exhibits tenderness and edema.       Arms:   Assessment/Plan: Right upper arm abscess 1)  I spoke to [name] in length and he understands fully his situation and the need for surgery this evening.  BLACKMAN,CHRISTOPHER Y 04/17/2014, 7:18 AM

## 2014-04-17 NOTE — Progress Notes (Signed)
[facility] PROGRESS NOTE  Marcus [name]:[id number] DOB: 08/03/1976 DOA: 04/16/2014 PCP: No primary provider on file.  Brief summary  Pt si a 38 yr old man who states that he works as a Scientist, forensic. He presents with complaints of pain, erythema, and induration of his rt upper extremity, Rt anterior chest wall, and rt neck. He states that the problem started about 2 weeks ago. He states that when he first noticed the original lesion then it was about the size of a dime and did not seem especially painful or hot. He states that the lesion stayed about the same for the next week and a half. Then in the last 5 days the area became larger, more erythematous and became tense. The patient ultimately punctured it and tried to drain it. When he begain this he noticed that the pus just kept coming back and the area of erythema grew. On Monday it extended up on top of his shoulder, around to his rt anterior chest and up his neck. His pain became excrutiating. He states that he has had fevers and chills for the last 3 days.  He has been started on broad spectrum abx and has a complex fluid collection of the RUE which will be drained by orthopedics later today.     Assessment/Plan  Severe sepsis with fever, tachycardia, hypotension due to abscess and cellulitis of the right upper extremity, neck, shoulder, and chest wall.  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 Magnus Ivan  Procedures:  [place] RUE  Antibiotics:  vanc 7/21 >>  Zosyn 7/21  >>  HPI/Subjective:  Severe pain in RUE and upset his pain medication was discontinued.  Feeling feverish and sweating.     Objective: Filed Vitals:   04/17/14 0141 04/17/14 0157 04/17/14 0244 04/17/14 0630  BP: 110/64 99/80 124/38 107/71  Pulse:  71 74 75  Temp:  99.5 F (37.5 C) 98.3 F (36.8 C) 98.2 F (36.8 C)  TempSrc:  Oral Oral Oral  Resp:  16 18   Height:   5\' 10"  (1.778 m)   Weight:   70.308 kg (155 lb)   SpO2:  98% 100% 100%    Intake/Output Summary (Last 24 hours) at 04/17/14 0836 Last data filed at 04/17/14 0600  Gross per 24 hour  Intake 343.75 ml  Output      0 ml  Net 343.75 ml   Filed Weights   04/16/14 2231 04/17/14 0244  Weight: 70.308 kg (155 lb) 70.308 kg (155 lb)    Exam:   General:  CM, No acute distress  HEENT:  NCAT, MMM  Cardiovascular:  RRR, nl S1, S2 no mrg, 2+ pulses, warm extremities  Respiratory:  CTAB, no increased WOB  Abdomen:   NABS, soft, NT/ND  MSK:   Normal tone and bulk, no LEE  Neuro:  Grossly intact  Skin:  Swelling of RUE with 1cm ulcerated area on medial bicep with purulent drainage.    Data Reviewed: Basic Metabolic Panel:  Recent Labs Lab  04/16/14 2315 04/17/14 0530  NA 137 136*  K 4.0 3.9  CL 97 98  CO2 28 26  GLUCOSE 94 107*  BUN 13 10  CREATININE 1.00 0.99  CALCIUM 9.5 9.2   Liver Function Tests: No results found for this basename: AST, ALT, ALKPHOS, BILITOT, PROT, ALBUMIN,  in the last 168 hours No results found for this basename: LIPASE, AMYLASE,  in the last 168 hours No results found for this basename: AMMONIA,  in the last 168 hours CBC:  Recent Labs Lab 04/16/14 2315 04/17/14 0530  WBC 13.3* 13.9*  NEUTROABS 10.5*  --   HGB 12.7* 13.0  HCT 36.8* 37.4*  MCV 83.3 81.7  PLT 210 231   Cardiac Enzymes: No results found for this basename: CKTOTAL, CKMB, CKMBINDEX, TROPONINI,  in the last 168 hours BNP (last 3 results) No results found for this basename: PROBNP,  in the last 8760  hours CBG: No results found for this basename: GLUCAP,  in the last 168 hours  No results found for this or any previous visit (from the past 240 hour(s)).   Studies: [place] Extrem Up Right Ltd  04/17/2014   CLINICAL DATA:  Draining wound medial right upper arm.  EXAM: ULTRASOUND RIGHT UPPER EXTREMITY LIMITED  TECHNIQUE: Ultrasound examination of the upper extremity soft tissues was performed in the area of clinical concern.  COMPARISON:  None.  FINDINGS: There is a complex fluid collection in the area of concern which is ill-defined with intervening foci of increased echogenicity and surrounding hyperemia. This fluid collection measures approximately 7.5 x 1.4 x 4.6 cm.  IMPRESSION: Complex fluid collection medially in the soft tissues of the right upper arm suspicious for ill-defined abscess.   Electronically Signed   By: Roxy HorsemanBill  Veazey M.D.   On: 04/17/2014 00:06    Scheduled Meds: . enoxaparin (LOVENOX) injection  40 mg Subcutaneous Q24H  . piperacillin-tazobactam (ZOSYN)  IV  3.375 g Intravenous Q8H  . vancomycin  1,000 mg Intravenous Q8H   Continuous Infusions: . sodium chloride 125 mL/hr at 04/17/14 0415    Active Problems:   Abscess and cellulitis   Pain in joint, upper arm   Febrile illness, acute   Leukocytosis, unspecified    Time spent: 30 min    Jamale Spangler, [facility]ACKENZIE  [facility] Pager [phone]. If 7PM-7AM, please contact night-coverage at [email or web address], password [email or web address] 04/17/2014, 8:36 AM  LOS: 1 day

## 2014-04-17 NOTE — Progress Notes (Signed)
MD note states pt positive for suicidal ideations.  Spoke with pt who denies this again, as on admission.  Pt remains not a suicide risk.

## 2014-04-17 NOTE — Progress Notes (Signed)
Received from care link.  VSS, pt ambulating without assistance.  Pt bathed at sink prior to getting in bed.  No apparent distress.  MD notified of arrival, awaiting orders.

## 2014-04-17 NOTE — Anesthesia Preprocedure Evaluation (Addendum)
Anesthesia Evaluation  Patient identified by MRN, date of birth, ID band Patient awake  General Assessment Comment:Abscess and cellulitis  Reviewed: Allergy & Precautions, H&P , NPO status , Patient's Chart, lab work & pertinent test results  Airway Mallampati: II TM Distance: >3 FB Neck ROM: Full    Dental no notable dental hx.    Pulmonary Current Smoker,  breath sounds clear to auscultation  Pulmonary exam normal       Cardiovascular negative cardio ROS  Rhythm:Regular Rate:Normal     Neuro/Psych negative neurological ROS  negative psych ROS   GI/Hepatic negative GI ROS, Neg liver ROS,   Endo/Other  negative endocrine ROS  Renal/GU negative Renal ROS  negative genitourinary   Musculoskeletal negative musculoskeletal ROS (+)   Abdominal   Peds negative pediatric ROS (+)  Hematology negative hematology ROS (+)   Anesthesia Other Findings   Reproductive/Obstetrics negative OB ROS                          Anesthesia Physical Anesthesia Plan  ASA: II  Anesthesia Plan: General   Post-op Pain Management:    Induction: Intravenous  Airway Management Planned: LMA  Additional Equipment:   Intra-op Plan:   Post-operative Plan: Extubation in OR  Informed Consent: I have reviewed the patients History and Physical, chart, labs and discussed the procedure including the risks, benefits and alternatives for the proposed anesthesia with the patient or authorized representative who has indicated his/her understanding and acceptance.   Dental advisory given  Plan Discussed with: CRNA and Surgeon  Anesthesia Plan Comments:         Anesthesia Quick Evaluation

## 2014-04-17 NOTE — Brief Op Note (Signed)
04/16/2014 - 04/17/2014  9:54 PM  PATIENT:  Rae LipsJody S Borum  38 y.o. male  PRE-OPERATIVE DIAGNOSIS:  right arm abcess  POST-OPERATIVE DIAGNOSIS:  right humerus abscess/infection  PROCEDURE:  Procedure(s): IRRIGATION AND DEBRIDEMENT RIGHT ARM (Right)  SURGEON:  Surgeon(s) and Role:    * Kathryne Hitchhristopher Y Saraphina Lauderbaugh, MD - Primary  PHYSICIAN ASSISTANT: Rexene EdisonGil Clark, PA-C  ANESTHESIA:   general  EBL:   50 cc  BLOOD ADMINISTERED:none  DRAINS: none   LOCAL MEDICATIONS USED:  NONE  SPECIMEN:  No Specimen  DISPOSITION OF SPECIMEN:  N/A  COUNTS:  YES  TOURNIQUET:  * No tourniquets in log *  DICTATION: .Other Dictation: Dictation Number [id number]  PLAN OF CARE: Admit to inpatient   PATIENT DISPOSITION:  PACU - hemodynamically stable.   Delay start of Pharmacological VTE agent (>24hrs) due to surgical blood loss or risk of bleeding: no

## 2014-04-17 NOTE — Care Management Note (Addendum)
    Page 1 of 1   04/19/2014     1:08:57 PM CARE MANAGEMENT NOTE 04/19/2014  Patient:  Marcus [name],Marcus [name]   Account Number:  [id number]  Date Initiated:  04/17/2014  Documentation initiated by:  Lanier ClamMAHABIR,Harlan Ervine  Subjective/Objective Assessment:   38 Y/O M ADMITTED Derwood KaplanW/RUE ABSCESS,CELLULITIS.     Action/Plan:   FROM HOME.   Anticipated DC Date:  04/19/2014   Anticipated DC Plan:  HOME/SELF CARE      DC Planning Services  CM consult      Choice offered to / List presented to:             Status of service:  Completed, signed off Medicare Important Message given?   (If response is "NO", the following Medicare IM given date fields will be blank) Date Medicare IM given:   Medicare IM given by:   Date Additional Medicare IM given:   Additional Medicare IM given by:    Discharge Disposition:  HOME/SELF CARE  Per UR Regulation:  Reviewed for med. 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  04/17/14 Olinda Nola RN,BSN NCM [phone] NO ANTICIPATED D/C NEEDS.

## 2014-04-17 NOTE — Progress Notes (Signed)
ANTIBIOTIC CONSULT NOTE - INITIAL  Pharmacy Consult for zosyn/vancomycin Indication: RUE, neck, shoulder and chest wall cellulitis/abscess  No Known Allergies  Patient Measurements: Height: 5\' 10"  (177.8 cm) Weight: 155 lb (70.308 kg) IBW/kg (Calculated) : 73   Vital Signs: Temp: 98.3 F (36.8 C) (07/21 0244) Temp src: Oral (07/21 0244) BP: 124/38 mmHg (07/21 0244) Pulse Rate: 74 (07/21 0244) Intake/Output from previous day: 07/20 0701 - 07/21 0700 In: 125 [I.V.:125] Out: -  Intake/Output from this shift: Total I/O In: 125 [I.V.:125] Out: -   Labs:  Recent Labs  04/16/14 2315  WBC 13.3*  HGB 12.7*  PLT 210  CREATININE 1.00   Estimated Creatinine Clearance: 98.6 ml/min (by C-G formula based on Cr of 1). No results found for this basename: VANCOTROUGH, VANCOPEAK, VANCORANDOM, GENTTROUGH, GENTPEAK, GENTRANDOM, TOBRATROUGH, TOBRAPEAK, TOBRARND, AMIKACINPEAK, AMIKACINTROU, AMIKACIN,  in the last 72 hours   Microbiology: No results found for this or any previous visit (from the past 720 hour(s)).  Medical History: History reviewed. No pertinent past medical history.  Medications:  Scheduled:  . enoxaparin (LOVENOX) injection  40 mg Subcutaneous Q24H  . piperacillin-tazobactam (ZOSYN)  IV  3.375 g Intravenous Q8H  . vancomycin  1,000 mg Intravenous Q8H   Infusions:  . sodium chloride 125 mL/hr at 04/17/14 0415   Assessment: 38 yo admitted with abscess/cellulitis of the RUE, neck, shoulder and chest wall. Vancomycin and Zosyn per Rx.   Goal of Therapy:  Vancomycin trough level 15-20 mcg/ml  Plan:   Vancomycin 1 gm IV q8h  Zosyn 3.375 Gm IV q8h EI infusion  F/U SCr/levels/cultures   Susanne GreenhouseGreen, Blonnie Maske R 04/17/2014,5:10 AM

## 2014-04-18 ENCOUNTER — Encounter (HOSPITAL_COMMUNITY): Payer: Self-pay | Admitting: Orthopaedic Surgery

## 2014-04-18 DIAGNOSIS — A419 Sepsis, unspecified organism: Principal | ICD-10-CM

## 2014-04-18 DIAGNOSIS — R6520 Severe sepsis without septic shock: Secondary | ICD-10-CM

## 2014-04-18 LAB — BASIC METABOLIC PANEL
ANION GAP: 13 (ref 5–15)
BUN: 13 mg/dL (ref 6–23)
CO2: 25 meq/L (ref 19–32)
CREATININE: 0.99 mg/dL (ref 0.50–1.35)
Calcium: 8.5 mg/dL (ref 8.4–10.5)
Chloride: 105 mEq/L (ref 96–112)
GFR calc non Af Amer: >90 mL/min (ref >90)
Glucose, Bld: 93 mg/dL (ref 70–99)
POTASSIUM: 4.6 meq/L (ref 3.7–5.3)
SODIUM: 143 meq/L (ref 137–147)

## 2014-04-18 LAB — VANCOMYCIN, TROUGH: VANCOMYCIN TR: 16.4 ug/mL (ref 10.0–20.0)

## 2014-04-18 MED ORDER — NICOTINE 21 MG/24HR TD PT24
21.0000 mg | MEDICATED_PATCH | Freq: Every day | TRANSDERMAL | Status: DC
  Administered 2014-04-18: 21 mg via TRANSDERMAL
  Filled 2014-04-18 (×2): qty 1

## 2014-04-18 MED ORDER — NICOTINE 14 MG/24HR TD PT24
14.0000 mg | MEDICATED_PATCH | Freq: Every day | TRANSDERMAL | Status: DC
  Administered 2014-04-18: 14 mg via TRANSDERMAL
  Filled 2014-04-18: qty 1

## 2014-04-18 NOTE — Op Note (Signed)
[name]:  [name], [name]                  ACCOUNT NO.:  [id number]  MEDICAL RECORD NO.:  [id number]  LOCATION:  1404                         [facility]  PHYSICIAN:  Vanita PandaChristopher Y. Magnus IvanBlackman, M.D.DATE OF BIRTH:  06-24-1976  DATE OF PROCEDURE:  04/17/2014 DATE OF DISCHARGE:                              OPERATIVE REPORT   PREOPERATIVE DIAGNOSIS:  Right upper arm abscess.  POSTOPERATIVE DIAGNOSIS:  Right upper arm abscess.  PROCEDURE:  Irrigation and debridement of right upper arm abscess with mainly irrigation of soft tissue and only debridement of skin over a small area.  FINDINGS:  Gross purulence in the subcutaneous and subfascial right proximal humerus area with no deep infiltration of the tissues with definite gross purulence found.  There was no necrotic tissue.  Other then margin of skin.  SURGEON:  Doneen Poissonhristopher Melady Chow, MD.  ASSISTING:  Richardean CanalGilbert Clark, PA-C.  ANESTHESIA:  General.  ESTIMATED BLOOD LOSS:  Less than 50 mL.  COMPLICATIONS:  None.  INDICATIONS:  Mr. Marcus Rice is a 38 year old gentleman, who presented to the emergency room yesterday evening after a 2-week history of worsening abscess involving his right arm.  He says this started as a small wound and kept draining, it got worse to the point where he went to the emergency room due to severe pain.  In the emergency room, an ultrasound was obtained that showed a large fluid collection at his proximal humerus.  There was definitely gross purulence draining.  He was admitted to the Medicine Service, started on IV antibiotics and now he presents for definitive irrigation and debridement.  He understands why this is being done.  PROCEDURE DESCRIPTION:  After informed consent was obtained, appropriate right arm was marked.  He was brought to the operating room and placed supine on the operating table with the right arm on a armboard.  General anesthesia was then obtained.  His right arm was prepped and draped  with DuraPrep and sterile drapes.  A time-out was called to identify correct patient and  correct arm.  The proximal 3rd and the proximal humerus was ellipsed out and there was gross purulence underlying this.  It tracked over a wide area of this subcutaneous and subfascial skin.  We made a separate incision near the axilla, retracted there due to indurated skin, and I found just some significant myositis, but no gross purulence in this area.  I was able to undermine and connect the two incisions underneath.  I then used pulsatile lavage, 3 L of normal saline solution to lavage this area out completely.  I then reapproximated the 2 skin incisions with interrupted 3-0 nylon suture.  Xeroform and well-padded sterile dressing was applied.  He was awakened and extubated to recovery room in stable condition.  All final counts were correct.  There were no complications noted. Postoperatively, I believe he should be on antibiotics for at least another 24 or 48 hours IV, therefore, we will transition to oral antibiotics and sending him home.     Vanita Pandahristopher Y. Magnus IvanBlackman, M.D.     CYB/MEDQ  D:  04/17/2014  T:  04/17/2014  Job:  [id number]

## 2014-04-18 NOTE — Discharge Instructions (Signed)
Place a small, thin layer of neosporin ointment daily over your right arm incisions followed by new dry dressing or large bandaids. You can get your incisions wet daily in the shower starting 04/21/14.

## 2014-04-18 NOTE — Progress Notes (Signed)
[facility] PROGRESS NOTE  Marcus Rice WUJ:[id number] DOB: April 11, 1976 DOA: 04/16/2014 PCP: No primary provider on file.  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 He presents with complaints of pain, erythema, and induration of his rt upper extremity, Rt anterior chest wall, and rt neck. He states that the problem started about 2 weeks ago found to have cellulitis, abscess   1. Severe sepsis with fever, tachycardia, hypotension due to abscess and cellulitis of the right upper extremity, neck, shoulder, and chest wall. 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 Leukocytosis due to cellulitis and abscess; cont atx   Diet: regular  Access: PIV  IVF: yes  Proph: lovenox  Code Status: full  Family Communication: patient alone  Disposition Plan: med-surg  Consultants:  Orthopedics, Dr. Magnus IvanBlackman Procedures:  US RUE Antibiotics:  vanc 7/21 >>  Zosyn 7/21 >>  HPI/Subjective: alert  Objective: Filed Vitals:   04/18/14 0511  BP: 94/56  Pulse: 79  Temp: 98.7 F (37.1 C)  Resp: 20    Intake/Output Summary (Last 24 hours) at 04/18/14 1404 Last data filed at 04/18/14 0600  Gross per 24 hour  Intake 2438.75 ml  Output      5 ml  Net 2433.75 ml   Filed Weights   04/16/14 2231 04/17/14 0244  Weight: 70.308 kg (155 lb) 70.308 kg (155 lb)    Exam:   General:  alert  Cardiovascular: s1,s2 rrr  Respiratory: CTA BL  Abdomen: soft, nt,nd   Musculoskeletal: no LE edema   Data Reviewed: Basic Metabolic Panel:  Recent Labs Lab 04/16/14 2315 04/17/14 0530 04/18/14 0410  NA 137 136* 143  K 4.0 3.9 4.6  CL 97 98 105  CO2 28 26  25   GLUCOSE 94 107* 93  BUN 13 10 13   CREATININE 1.00 0.99 0.99  CALCIUM 9.5 9.2 8.5   Liver Function Tests: No results found for this basename: AST, ALT, ALKPHOS, BILITOT, PROT, ALBUMIN,  in the last 168 hours No results found for this basename: LIPASE, AMYLASE,  in the last 168 hours No results found for this basename: AMMONIA,  in the last 168 hours CBC:  Recent Labs Lab 04/16/14 2315 04/17/14 0530  WBC 13.3* 13.9*  NEUTROABS 10.5*  --   HGB 12.7* 13.0  HCT 36.8* 37.4*  MCV 83.3 81.7  PLT 210 231   Cardiac Enzymes: No results found for this basename: CKTOTAL, CKMB, CKMBINDEX, TROPONINI,  in the last 168 hours BNP (last 3 results) No results found for this basename: PROBNP,  in the last 8760 hours CBG: No results found for this basename: GLUCAP,  in the last 168 hours  Recent Results (from the past 240 hour(s))  CULTURE, ROUTINE-ABSCESS     Status: None   Collection Time    04/16/14 10:57 PM      Result Value Ref Range Status   Specimen Description ARM RIGHT UPPER   Final   Special Requests NONE   Final   Gram Stain     Final   Value: MODERATE WBC PRESENT,BOTH PMN AND MONONUCLEAR     NO SQUAMOUS EPITHELIAL CELLS SEEN  RARE GRAM POSITIVE COCCI IN PAIRS     IN CLUSTERS     Performed at [facility]   Culture     Final   Value: NO GROWTH 1 DAY     Performed at [facility]   Report Status PENDING   Incomplete  CULTURE, BLOOD (ROUTINE X 2)     Status: None   Collection Time    04/16/14 11:15 PM      Result Value Ref Range Status   Specimen Description BLOOD LEFT ARM   Final   Special Requests BOTTLES DRAWN AEROBIC AND ANAEROBIC [facility] EACH   Final   Culture  Setup Time     Final   Value: May 01, 2014 08:33     Performed at [facility]   Culture     Final   Value:        BLOOD CULTURE RECEIVED NO GROWTH TO DATE CULTURE WILL BE HELD FOR 5 DAYS BEFORE ISSUING A FINAL NEGATIVE REPORT     Performed at [facility]   Report Status  PENDING   Incomplete  CULTURE, BLOOD (ROUTINE X 2)     Status: None   Collection Time    May 01, 2014  5:30 AM      Result Value Ref Range Status   Specimen Description BLOOD RIGHT HAND   Final   Special Requests BOTTLES DRAWN AEROBIC AND ANAEROBIC [facility] EACH   Final   Culture  Setup Time     Final   Value: 05/01/14 08:33     Performed at [facility]   Culture     Final   Value:        BLOOD CULTURE RECEIVED NO GROWTH TO DATE CULTURE WILL BE HELD FOR 5 DAYS BEFORE ISSUING A FINAL NEGATIVE REPORT     Performed at [facility]   Report Status PENDING   Incomplete     Studies: [place] Extrem Up Right Ltd  May 01, 2014   CLINICAL DATA:  Draining wound medial right upper arm.  EXAM: ULTRASOUND RIGHT UPPER EXTREMITY LIMITED  TECHNIQUE: Ultrasound examination of the upper extremity soft tissues was performed in the area of clinical concern.  COMPARISON:  None.  FINDINGS: There is a complex fluid collection in the area of concern which is ill-defined with intervening foci of increased echogenicity and surrounding hyperemia. This fluid collection measures approximately 7.5 x 1.4 x 4.6 cm.  IMPRESSION: Complex fluid collection medially in the soft tissues of the right upper arm suspicious for ill-defined abscess.   Electronically Signed   By: Roxy Horseman M.D.   On: 05/01/2014 00:06    Scheduled Meds: . enoxaparin (LOVENOX) injection  40 mg Subcutaneous Q24H  . nicotine  14 mg Transdermal Daily  . piperacillin-tazobactam (ZOSYN)  IV  3.375 g Intravenous Q8H  . vancomycin  1,000 mg Intravenous Q8H   Continuous Infusions: . sodium chloride 10 mL/hr at 04/18/14 0756    Active Problems:   Abscess and cellulitis   Pain in joint, upper arm   Febrile illness, acute   Leukocytosis, unspecified   Severe sepsis    Time spent: >35    Esperanza Sheets  [facility] Pager [phone]. If 7PM-7AM, please contact night-coverage at [email or web address], password [facility] 04/18/2014, 2:04 PM  LOS: 2  days

## 2014-04-18 NOTE — Progress Notes (Signed)
ANTIBIOTIC CONSULT NOTE - FOLLOW UP  Pharmacy Consult for Vancomycin and Zosyn Indication: Right upper arm abscess  No Known Allergies  Patient Measurements: Height: 5\' 10"  (177.8 cm) Weight: 155 lb (70.308 kg) IBW/kg (Calculated) : 73  Vital Signs: Temp: 98.7 F (37.1 C) (07/22 0511) Temp src: Oral (07/22 0511) BP: 94/56 mmHg (07/22 0511) Pulse Rate: 79 (07/22 0511) Intake/Output from previous day: 07/21 0701 - 07/22 0700 In: 2678.8 [P.O.:480; I.V.:1448.8; IV Piggyback:750] Out: 5 [Blood:5] Intake/Output from this shift:    Labs:  Recent Labs  04/16/14 2315 04/17/14 0530 04/18/14 0410  WBC 13.3* 13.9*  --   HGB 12.7* 13.0  --   PLT 210 231  --   CREATININE 1.00 0.99 0.99   Estimated Creatinine Clearance: 99.6 ml/min (by C-G formula based on Cr of 0.99).  Recent Labs  04/18/14 0648  VANCOTROUGH 16.4     Microbiology: Recent Results (from the past 720 hour(s))  CULTURE, ROUTINE-ABSCESS     Status: None   Collection Time    04/16/14 10:57 PM      Result Value Ref Range Status   Specimen Description ARM RIGHT UPPER   Final   Special Requests NONE   Final   Gram Stain     Final   Value: MODERATE WBC PRESENT,BOTH PMN AND MONONUCLEAR     NO SQUAMOUS EPITHELIAL CELLS SEEN     RARE GRAM POSITIVE COCCI IN PAIRS     IN CLUSTERS     [facility]   Culture     Final   Value: NO GROWTH 1 DAY     [facility] at [facility]   Report Status PENDING   Incomplete    Anti-infectives   Start     Dose/Rate Route Frequency Ordered Stop   04/17/14 1000  piperacillin-tazobactam (ZOSYN) IVPB 3.375 g     3.375 g 12.5 mL/hr over 240 Minutes Intravenous Every 8 hours 04/17/14 0429     04/17/14 0800  vancomycin (VANCOCIN) IVPB 1000 mg/200 mL premix     1,000 mg 200 mL/hr over 60 Minutes Intravenous Every 8 hours 04/17/14 0429     04/17/14 0030  piperacillin-tazobactam (ZOSYN) IVPB 3.375 g     3.375 g 100 mL/hr over 30 Minutes Intravenous  Once  04/17/14 0020 04/17/14 0136   04/16/14 2315  vancomycin (VANCOCIN) IVPB 1000 mg/200 mL premix     1,000 mg 200 mL/hr over 60 Minutes Intravenous  Once 04/16/14 2300 04/17/14 0104      Assessment: 37 yoM with abscess and cellulitis of the right upper extremity, neck, shoulder, and chest wall. S/p I&D 7/21PM. Pharmacy consulted to dose vancomycin and zosyn.  7/20 >> Vancomycin >> 7/20 >> Zosyn >>  Tmax: Afeb WBC: elevated 13.9 Renal: SCr 0.99, CrCl ~100 ml/min  Microbiology: 7/20 blood x 2: sent 7/20 RUE abscess: NG x 1 day  Drug level / dose changes info: 7/22 VT at 0700 = 16.4 (therapeutic) - continue 1g q8h  Goal of Therapy:  Eradication of infection Vancomycin trough level 15-20 mcg/ml Zosyn dose per renal function  Plan:   Day #3 Vanc/Zosyn  Continue Vancomycin 1g IV q8h  Continue Zosyn 3.375gm IV q8h (4hr extended infusions) Follow up renal function & cultures Ortho plan noted to transition to oral abx, likely doxycycline, on 7/23  Loralee PacasErin Khrystina Bonnes, PharmD, BCPS Pager: [phone] 04/18/2014,8:32 AM

## 2014-04-18 NOTE — Addendum Note (Signed)
Addendum created 04/18/14 1226 by Elisabeth CaraLacey A Corie Allis, CRNA   Modules edited: Anesthesia LDA

## 2014-04-18 NOTE — Anesthesia Postprocedure Evaluation (Signed)
  Anesthesia Post-op Note  Patient: Marcus Rice  Procedure(s) Performed: Procedure(s) (LRB): IRRIGATION AND DEBRIDEMENT RIGHT ARM (Right)  Patient Location: PACU  Anesthesia Type: General  Level of Consciousness: awake and alert   Airway and Oxygen Therapy: Patient Spontanous Breathing  Post-op Pain: mild  Post-op Assessment: Post-op Vital signs reviewed, Patient's Cardiovascular Status Stable, Respiratory Function Stable, Patent Airway and No signs of Nausea or vomiting  Last Vitals:  Filed Vitals:   04/17/14 2257  BP: 115/70  Pulse: 59  Temp: 36.4 C  Resp: 12    Post-op Vital Signs: stable   Complications: No apparent anesthesia complications

## 2014-04-18 NOTE — Progress Notes (Signed)
Patient ID: Marcus Rice, male   DOB: 16-Aug-1976, 38 y.o.   MRN: [id number] Surgery went well last evening.  Already reports some reduced pain.  I did find gross purulence in the subQ and subfascial area of his proximal humerus and decompressed this.  I think he needs [facility] and then discharge to home tomorrow on oral antibiotics to cover most likely staph, such as doxycycline.  Will follow.

## 2014-04-19 LAB — BASIC METABOLIC PANEL
ANION GAP: 14 (ref 5–15)
BUN: 10 mg/dL (ref 6–23)
CHLORIDE: 104 meq/L (ref 96–112)
CO2: 24 meq/L (ref 19–32)
CREATININE: 1.13 mg/dL (ref 0.50–1.35)
Calcium: 9 mg/dL (ref 8.4–10.5)
GFR calc non Af Amer: 80 mL/min — ABNORMAL LOW (ref >90)
Glucose, Bld: 103 mg/dL — ABNORMAL HIGH (ref 70–99)
POTASSIUM: 3.9 meq/L (ref 3.7–5.3)
SODIUM: 142 meq/L (ref 137–147)

## 2014-04-19 MED ORDER — DOXYCYCLINE HYCLATE 100 MG PO TABS: 100.0000 mg | ORAL_TABLET | Freq: Two times a day (BID) | ORAL | Status: DC

## 2014-04-19 MED ORDER — ACETAMINOPHEN 325 MG PO TABS: 650.0000 mg | ORAL_TABLET | Freq: Four times a day (QID) | ORAL | Status: AC | PRN

## 2014-04-19 MED ORDER — NICOTINE 21 MG/24HR TD PT24: 21.0000 mg | MEDICATED_PATCH | Freq: Every day | TRANSDERMAL | Status: AC

## 2014-04-19 MED ORDER — CEPHALEXIN 500 MG PO CAPS: 500.0000 mg | ORAL_CAPSULE | Freq: Four times a day (QID) | ORAL | Status: AC

## 2014-04-19 NOTE — Discharge Summary (Signed)
Physician Discharge Summary  Rae LipsJody S Rennaker ZOX:[id number] DOB: Jul 11, 1976 DOA: 04/16/2014  PCP: No primary provider on file.  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 He presents with complaints of pain, erythema, and induration of his rt upper extremity, Rt anterior chest wall, and rt neck. He states that the problem started about 2 weeks ago found to have cellulitis, abscess   Hospital Course: 1. Severe sepsis with fever, tachycardia, hypotension due to abscess and cellulitis of the right upper extremity, neck, shoulder, and chest wall. Temperature already trending down  -US: Complex fluid collection medially in the soft tissues of the right upper arm suspicious for ill-defined abscess  - sepsis resolved on vanc and zosyn; s/p I&D by ortho on 7/20;  -blood cultures NGTD; wound cultures +staph pend sensitivity; afebrile, clinically improved after I&D; changed to PO atx, and f/u with ortho in 10 days       Procedures:  I&D (i.e. Studies not automatically included, echos, thoracentesis, etc; not x-rays)  Consultations:  Ortho   Discharge Exam: Filed Vitals:   04/19/14 0510  BP: 113/94  Pulse: 66  Temp: 98.3 F (36.8 C)  Resp: 20    General: alert Cardiovascular: s1,s2 rrr Respiratory: CTA BL  Discharge Instructions  Discharge Instructions   Diet - low sodium heart healthy    Complete by:  As directed      Discharge instructions    Complete by:  As  directed   Please follow up with orthopedics in 10 days     Increase activity slowly    Complete by:  As directed             Medication List         acetaminophen 325 MG tablet  Commonly known as:  TYLENOL  Take 2 tablets (650 mg total) by mouth every 6 (six) hours as needed for mild pain (or Fever >/= 101).     cephALEXin 500 MG capsule  Commonly known as:  KEFLEX  Take 1 capsule (500 mg total) by mouth 4 (four) times daily.     doxycycline 100 MG tablet  Commonly known as:  VIBRA-TABS  Take 1 tablet (100 mg total) by mouth 2 (two) times daily.     nicotine 21 mg/24hr patch  Commonly known as:  NICODERM CQ - dosed in mg/24 hours  Place 1 patch (21 mg total) onto the skin daily.       No Known Allergies     Follow-up Information   Follow up with Kathryne HitchBLACKMAN,CHRISTOPHER Y, MD In 2 weeks.   Specialty:  Orthopedic Surgery   Contact information:   [address] [place] [address] [phone]        The results of significant diagnostics from this hospitalization (including imaging, microbiology, ancillary and laboratory) are listed below for reference.    Significant Diagnostic Studies: [facility] Extrem Up Right [facility]  04/17/2014   CLINICAL DATA:  Draining wound medial right upper arm.  EXAM: ULTRASOUND RIGHT UPPER EXTREMITY LIMITED  TECHNIQUE: Ultrasound examination of the upper extremity soft tissues was performed in the area of clinical concern.  COMPARISON:  None.  FINDINGS: There is a complex fluid collection in the area of concern which is ill-defined with intervening foci of increased echogenicity and surrounding hyperemia. This fluid collection measures approximately 7.5 x 1.4 x 4.6 cm.  IMPRESSION: Complex fluid collection medially in the soft tissues of the right upper arm suspicious for ill-defined abscess.   Electronically Signed   By: Roxy Horseman M.D.   On: 04/17/2014 00:06    Microbiology: Recent Results (from the past 240 hour(s))  CULTURE,  ROUTINE-ABSCESS     Status: None   Collection Time    04/16/14 10:57 PM      Result Value Ref Range Status   Specimen Description ARM RIGHT UPPER   Final   Special Requests NONE   Final   Gram Stain     Final   Value: MODERATE WBC PRESENT,BOTH PMN AND MONONUCLEAR     NO SQUAMOUS EPITHELIAL CELLS SEEN     RARE GRAM POSITIVE COCCI IN PAIRS     IN CLUSTERS     Performed at [facility]   Culture     Final   Value: NO GROWTH 1 DAY     Performed at [facility]   Report Status PENDING   Incomplete  CULTURE, BLOOD (ROUTINE X 2)     Status: None   Collection Time    04/16/14 11:15 PM      Result Value Ref Range Status   Specimen Description BLOOD LEFT ARM   Final   Special Requests BOTTLES DRAWN AEROBIC AND ANAEROBIC [facility] EACH   Final   Culture  Setup Time     Final   Value: 04/17/2014 08:33     Performed at [facility]   Culture     Final   Value:        BLOOD CULTURE RECEIVED NO GROWTH TO DATE CULTURE WILL BE HELD FOR 5 DAYS BEFORE ISSUING A FINAL NEGATIVE REPORT     Performed at [facility]   Report Status PENDING   Incomplete  CULTURE, BLOOD (ROUTINE X 2)     Status: None   Collection Time    04/17/14  5:30 AM      Result Value Ref Range Status   Specimen Description BLOOD RIGHT HAND   Final   Special Requests BOTTLES DRAWN AEROBIC AND ANAEROBIC [facility] EACH   Final   Culture  Setup Time     Final   Value: 04/17/2014 08:33     Performed at [facility]   Culture     Final   Value:        BLOOD CULTURE RECEIVED NO GROWTH TO DATE CULTURE WILL BE HELD FOR 5 DAYS BEFORE ISSUING A FINAL NEGATIVE REPORT     Performed at [facility]   Report Status PENDING   Incomplete     Labs: Basic Metabolic Panel:  Recent Labs Lab 04/16/14 2315 04/17/14 0530 04/18/14 0410 04/19/14 0420  NA 137 136* 143 142  K 4.0 3.9 4.6 3.9  CL 97 98 105 104  CO2 28 26 25 24   GLUCOSE 94 107* 93 103*  BUN 13 10 13 10   CREATININE 1.00 0.99 0.99 1.13   CALCIUM 9.5 9.2 8.5 9.0   Liver Function Tests: No results found for  this basename: AST, ALT, ALKPHOS, BILITOT, PROT, ALBUMIN,  in the last 168 hours No results found for this basename: LIPASE, AMYLASE,  in the last 168 hours No results found for this basename: AMMONIA,  in the last 168 hours CBC:  Recent Labs Lab 04/16/14 2315 04/17/14 0530  WBC 13.3* 13.9*  NEUTROABS 10.5*  --   HGB 12.7* 13.0  HCT 36.8* 37.4*  MCV 83.3 81.7  PLT 210 231   Cardiac Enzymes: No results found for this basename: CKTOTAL, CKMB, CKMBINDEX, TROPONINI,  in the last 168 hours BNP: BNP (last 3 results) No results found for this basename: PROBNP,  in the last 8760 hours CBG: No results found for this basename: GLUCAP,  in the last 168 hours     Signed:  Esperanza Sheets  [facility] 04/19/2014, 10:37 AM

## 2014-04-19 NOTE — Progress Notes (Signed)
Patient ID: Marcus LipsJody S Rice, male   DOB: 09/25/1976, 38 y.o.   MRN: [id number] Doing well.  Pain decreased.  Right arm dressing changed.  Bloody drainage only due to loose wound closure secondary to his infection (to be expected).  Can discharge to home from Ortho standpoint on oral antibiotics.

## 2014-04-20 LAB — CULTURE, ROUTINE-ABSCESS

## 2014-04-23 LAB — CULTURE, BLOOD (ROUTINE X 2)
Culture: NO GROWTH
Culture: NO GROWTH

## 2014-12-06 ENCOUNTER — Encounter (HOSPITAL_BASED_OUTPATIENT_CLINIC_OR_DEPARTMENT_OTHER): Payer: Self-pay | Admitting: Emergency Medicine

## 2014-12-06 ENCOUNTER — Emergency Department (HOSPITAL_BASED_OUTPATIENT_CLINIC_OR_DEPARTMENT_OTHER): Payer: Worker's Compensation

## 2014-12-06 DIAGNOSIS — Y9339 Activity, other involving climbing, rappelling and jumping off: Secondary | ICD-10-CM | POA: Insufficient documentation

## 2014-12-06 DIAGNOSIS — Z79899 Other long term (current) drug therapy: Secondary | ICD-10-CM | POA: Insufficient documentation

## 2014-12-06 DIAGNOSIS — S8992XA Unspecified injury of left lower leg, initial encounter: Secondary | ICD-10-CM | POA: Insufficient documentation

## 2014-12-06 DIAGNOSIS — W11XXXA Fall on and from ladder, initial encounter: Secondary | ICD-10-CM | POA: Insufficient documentation

## 2014-12-06 DIAGNOSIS — Z792 Long term (current) use of antibiotics: Secondary | ICD-10-CM | POA: Insufficient documentation

## 2014-12-06 DIAGNOSIS — Y990 Civilian activity done for income or pay: Secondary | ICD-10-CM | POA: Insufficient documentation

## 2014-12-06 DIAGNOSIS — Z720 Tobacco use: Secondary | ICD-10-CM | POA: Insufficient documentation

## 2014-12-06 DIAGNOSIS — S3992XA Unspecified injury of lower back, initial encounter: Secondary | ICD-10-CM | POA: Insufficient documentation

## 2014-12-06 DIAGNOSIS — S8991XA Unspecified injury of right lower leg, initial encounter: Secondary | ICD-10-CM | POA: Insufficient documentation

## 2014-12-06 DIAGNOSIS — Y92832 Beach as the place of occurrence of the external cause: Secondary | ICD-10-CM | POA: Insufficient documentation

## 2014-12-06 NOTE — ED Notes (Signed)
Patient reports he was climbing a ladder 2 weeks ago at work in the rain and fell off the ladder.  Reports he was 15 feet in the air when he fell.  Reports he injured his back and both knees.

## 2014-12-07 ENCOUNTER — Emergency Department (HOSPITAL_BASED_OUTPATIENT_CLINIC_OR_DEPARTMENT_OTHER)
Admission: EM | Admit: 2014-12-07 | Discharge: 2014-12-07 | Disposition: A | Discharge status: Home / Self Care | Payer: Worker's Compensation | Attending: Emergency Medicine | Admitting: Emergency Medicine

## 2014-12-07 ENCOUNTER — Emergency Department (HOSPITAL_BASED_OUTPATIENT_CLINIC_OR_DEPARTMENT_OTHER): Payer: Worker's Compensation

## 2014-12-07 ENCOUNTER — Encounter (HOSPITAL_BASED_OUTPATIENT_CLINIC_OR_DEPARTMENT_OTHER): Payer: Self-pay | Admitting: Emergency Medicine

## 2014-12-07 DIAGNOSIS — W19XXXA Unspecified fall, initial encounter: Secondary | ICD-10-CM

## 2014-12-07 MED ORDER — MELOXICAM 7.5 MG PO TABS: 7.5000 mg | ORAL_TABLET | Freq: Every day | ORAL | Status: DC

## 2014-12-07 NOTE — ED Provider Notes (Signed)
CSN: [id number]     Arrival date & time 12/06/14  2154 History   First MD Initiated Contact with Patient 12/07/14 0143     Chief Complaint  Patient presents with  . Fall     (Consider location/radiation/quality/duration/timing/severity/associated sxs/prior Treatment) Patient is a 39 y.o. male presenting with fall. The history is provided by the patient.  Fall This is a new problem. The current episode started more than 1 week ago (2 weeks ago from a ladder landed on the right foot). The problem occurs constantly. The problem has not changed since onset.Pertinent negatives include no chest pain, no abdominal pain, no headaches and no shortness of breath. Associated symptoms comments: B knee and LBP. Nothing aggravates the symptoms. Treatments tried: ice and nsaids. The treatment provided no relief.    History reviewed. No pertinent past medical history. Past Surgical History  Procedure Laterality Date  . Hernia repair    . I&d extremity Right 04/17/2014    Procedure: IRRIGATION AND DEBRIDEMENT RIGHT ARM;  Surgeon: Kathryne Hitchhristopher Y Blackman, MD;  Location: [facility]S;  Service: Orthopedics;  Laterality: Right;   History reviewed. No pertinent family history. History  Substance Use Topics  . Smoking status: Current Every Day Smoker -- 0.50 packs/day    Types: Cigarettes  . Smokeless tobacco: Not on file  . Alcohol Use: No    Review of Systems  Respiratory: Negative for shortness of breath.   Cardiovascular: Negative for chest pain.  Gastrointestinal: Negative for abdominal pain.  Neurological: Negative for headaches.  All other systems reviewed and are negative.     Allergies  Review of patient's allergies indicates no known allergies.  Home Medications   Prior to Admission medications   Medication Sig Start Date End Date Taking? Authorizing Provider  acetaminophen (TYLENOL) 325 MG tablet Take 2 tablets (650 mg total) by mouth every 6 (six) hours as needed for mild pain (or Fever  >/= 101). 04/19/14   Esperanza SheetsUlugbek N Buriev, MD  cephALEXin (KEFLEX) 500 MG capsule Take 1 capsule (500 mg total) by mouth 4 (four) times daily. 04/19/14   Esperanza SheetsUlugbek N Buriev, MD  doxycycline (VIBRA-TABS) 100 MG tablet Take 1 tablet (100 mg total) by mouth 2 (two) times daily. 04/19/14   Esperanza SheetsUlugbek N Buriev, MD  nicotine (NICODERM CQ - DOSED IN MG/24 HOURS) 21 mg/24hr patch Place 1 patch (21 mg total) onto the skin daily. 04/19/14   Esperanza SheetsUlugbek N Buriev, MD   BP 110/84 mmHg  Pulse 89  Temp(Src) 98.4 F (36.9 C) (Oral)  Resp 16  Ht 5\' 10"  (1.778 m)  Wt 155 lb (70.308 kg)  BMI 22.24 kg/m2  SpO2 98% Physical Exam  Constitutional: He is oriented to person, place, and time. He appears well-developed and well-nourished. No distress.  HENT:  Head: Normocephalic and atraumatic. Head is without raccoon's eyes and without Battle's sign.  Right Ear: No mastoid tenderness. No hemotympanum.  Left Ear: No mastoid tenderness. No hemotympanum.  Mouth/Throat: Oropharynx is clear and moist.  Eyes: Conjunctivae are normal. Pupils are equal, round, and reactive to light.  Neck: Normal range of motion. Neck supple.  No step offs or crepitance of the ctl spine  Cardiovascular: Normal rate and regular rhythm.   Pulmonary/Chest: Effort normal and breath sounds normal. No respiratory distress. He has no wheezes. He has no rales.  Abdominal: Soft. Bowel sounds are normal. There is no tenderness.  Musculoskeletal: Normal range of motion. He exhibits no edema.       Right knee: Normal.  He exhibits normal range of motion, no swelling, no effusion, no ecchymosis, no deformity, no laceration, no erythema, normal alignment, no LCL laxity, normal patellar mobility, no bony tenderness, normal meniscus and no MCL laxity. No tenderness found. No medial joint line, no lateral joint line, no MCL, no LCL and no patellar tendon tenderness noted.       Left knee: Normal. He exhibits normal range of motion, no swelling, no effusion, no ecchymosis,  no deformity, no laceration, no erythema, normal alignment, no LCL laxity, normal patellar mobility, no bony tenderness, normal meniscus and no MCL laxity. No tenderness found. No medial joint line, no lateral joint line, no MCL, no LCL and no patellar tendon tenderness noted.  Intact gait  Neurological: He is alert and oriented to person, place, and time. He has normal reflexes.  Skin: Skin is warm and dry.    ED Course  Procedures (including critical care time) Labs Review Labs Reviewed - No data to display  Imaging Review Dg Lumbar Spine Complete  12/07/2014   CLINICAL DATA:  Patient fell 15 feet off of a ladder about 2 weeks ago. Increasing low back pain over the last few days.  EXAM: LUMBAR SPINE - COMPLETE 4+ VIEW  COMPARISON:  CT abdomen and pelvis 04/06/2006  FINDINGS: There is no evidence of lumbar spine fracture. Alignment is normal. Intervertebral disc spaces are maintained.  IMPRESSION: Negative.   Electronically Signed   By: Burman Nieves M.D.   On: 12/07/2014 00:21     EKG Interpretation None      MDM   Final diagnoses:  Fall    Will need to follow up with [facility] med panel and orthopedics for ongoing care.  Stable gait stable for d/c at this time    Adrea Sherpa, MD 12/07/14 1610

## 2014-12-07 NOTE — ED Notes (Signed)
Pt alert, NAD, calm, interactive, resps e/u, speaking in clear complete sentences. Denies pain at this time. Mentions spasms and numbness in mid back. Mentions fell 2 weeks ago. Has not been seen for same since injury/incident. Describes as, "Larey SeatFell 15' from ladder [place] landed in Agricultural consultantwet concrete. Here for back and bilateral knees.

## 2016-05-19 ENCOUNTER — Encounter (HOSPITAL_COMMUNITY): Payer: Self-pay | Admitting: Emergency Medicine

## 2016-05-19 ENCOUNTER — Emergency Department (HOSPITAL_COMMUNITY)
Admission: EM | Admit: 2016-05-19 | Discharge: 2016-05-19 | Disposition: A | Discharge status: Home / Self Care | Payer: Self-pay | Attending: Emergency Medicine | Admitting: Emergency Medicine

## 2016-05-19 ENCOUNTER — Emergency Department (HOSPITAL_COMMUNITY): Payer: Self-pay

## 2016-05-19 DIAGNOSIS — Z23 Encounter for immunization: Secondary | ICD-10-CM | POA: Insufficient documentation

## 2016-05-19 DIAGNOSIS — M25562 Pain in left knee: Secondary | ICD-10-CM | POA: Insufficient documentation

## 2016-05-19 DIAGNOSIS — Y92410 Unspecified street and highway as the place of occurrence of the external cause: Secondary | ICD-10-CM | POA: Insufficient documentation

## 2016-05-19 DIAGNOSIS — T148XXA Other injury of unspecified body region, initial encounter: Secondary | ICD-10-CM

## 2016-05-19 DIAGNOSIS — Y999 Unspecified external cause status: Secondary | ICD-10-CM | POA: Insufficient documentation

## 2016-05-19 DIAGNOSIS — Y939 Activity, unspecified: Secondary | ICD-10-CM | POA: Insufficient documentation

## 2016-05-19 DIAGNOSIS — Z79899 Other long term (current) drug therapy: Secondary | ICD-10-CM | POA: Insufficient documentation

## 2016-05-19 DIAGNOSIS — S52122A Displaced fracture of head of left radius, initial encounter for closed fracture: Secondary | ICD-10-CM | POA: Insufficient documentation

## 2016-05-19 DIAGNOSIS — F17210 Nicotine dependence, cigarettes, uncomplicated: Secondary | ICD-10-CM | POA: Insufficient documentation

## 2016-05-19 MED ORDER — IBUPROFEN 800 MG PO TABS: 800.0000 mg | ORAL_TABLET | Freq: Three times a day (TID) | ORAL | 0 refills | Status: AC | PRN

## 2016-05-19 MED ORDER — METHOCARBAMOL 500 MG PO TABS: 500.0000 mg | ORAL_TABLET | Freq: Three times a day (TID) | ORAL | 0 refills | Status: AC | PRN

## 2016-05-19 MED ORDER — IBUPROFEN 800 MG PO TABS
800.0000 mg | ORAL_TABLET | Freq: Once | ORAL | Status: AC
  Administered 2016-05-19: 800 mg via ORAL
  Filled 2016-05-19: qty 1

## 2016-05-19 MED ORDER — TETANUS-DIPHTH-ACELL PERTUSSIS 5-2.5-18.5 LF-MCG/0.5 IM SUSP
0.5000 mL | Freq: Once | INTRAMUSCULAR | Status: AC
  Administered 2016-05-19: 0.5 mL via INTRAMUSCULAR
  Filled 2016-05-19: qty 0.5

## 2016-05-19 NOTE — Discharge Instructions (Addendum)
Read the information below.  Use the prescribed medication as directed.  Please discuss all new medications with your pharmacist.  You may return to the Emergency Department at any time for worsening condition or any new symptoms that concern you.     If you develop uncontrolled pain, weakness or numbness of the extremity, severe discoloration of the skin, or you are unable to walk or move your joints, return to the ER for a recheck.
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
## 2016-08-11 ENCOUNTER — Emergency Department (HOSPITAL_COMMUNITY): Payer: Self-pay

## 2016-08-11 ENCOUNTER — Encounter (HOSPITAL_COMMUNITY): Payer: Self-pay | Admitting: Emergency Medicine

## 2016-08-11 ENCOUNTER — Emergency Department (HOSPITAL_COMMUNITY)
Admission: EM | Admit: 2016-08-11 | Discharge: 2016-08-12 | Disposition: A | Discharge status: Home / Self Care | Payer: Self-pay | Attending: Emergency Medicine | Admitting: Emergency Medicine

## 2016-08-11 DIAGNOSIS — F17210 Nicotine dependence, cigarettes, uncomplicated: Secondary | ICD-10-CM | POA: Insufficient documentation

## 2016-08-11 DIAGNOSIS — Z79899 Other long term (current) drug therapy: Secondary | ICD-10-CM | POA: Insufficient documentation

## 2016-08-11 DIAGNOSIS — R109 Unspecified abdominal pain: Secondary | ICD-10-CM | POA: Insufficient documentation

## 2016-08-11 LAB — COMPREHENSIVE METABOLIC PANEL
ALT: 17 U/L (ref 17–63)
ANION GAP: 7 (ref 5–15)
AST: 18 U/L (ref 15–41)
Albumin: 4.2 g/dL (ref 3.5–5.0)
Alkaline Phosphatase: 70 U/L (ref 38–126)
BUN: 21 mg/dL — ABNORMAL HIGH (ref 6–20)
CHLORIDE: 108 mmol/L (ref 101–111)
CO2: 26 mmol/L (ref 22–32)
Calcium: 9.1 mg/dL (ref 8.9–10.3)
Creatinine, Ser: 0.9 mg/dL (ref 0.61–1.24)
GFR calc non Af Amer: >60 mL/min (ref >60)
Glucose, Bld: 98 mg/dL (ref 65–99)
Potassium: 3.8 mmol/L (ref 3.5–5.1)
SODIUM: 141 mmol/L (ref 135–145)
Total Bilirubin: 0.6 mg/dL (ref 0.3–1.2)
Total Protein: 6.9 g/dL (ref 6.5–8.1)

## 2016-08-11 LAB — CBC
HCT: 38.6 % — ABNORMAL LOW (ref 39.0–52.0)
HEMOGLOBIN: 13 g/dL (ref 13.0–17.0)
MCH: 28.8 pg (ref 26.0–34.0)
MCHC: 33.7 g/dL (ref 30.0–36.0)
MCV: 85.6 fL (ref 78.0–100.0)
Platelets: 239 10*3/uL (ref 150–400)
RBC: 4.51 MIL/uL (ref 4.22–5.81)
RDW: 14.2 % (ref 11.5–15.5)
WBC: 8.8 10*3/uL (ref 4.0–10.5)

## 2016-08-11 LAB — URINALYSIS, ROUTINE W REFLEX MICROSCOPIC
Bilirubin Urine: NEGATIVE
GLUCOSE, UA: NEGATIVE mg/dL
HGB URINE DIPSTICK: NEGATIVE
Ketones, ur: NEGATIVE mg/dL
Leukocytes, UA: NEGATIVE
Nitrite: NEGATIVE
Protein, ur: NEGATIVE mg/dL
SPECIFIC GRAVITY, URINE: 1.027 (ref 1.005–1.030)
pH: 7 (ref 5.0–8.0)

## 2016-08-11 LAB — LIPASE, BLOOD: LIPASE: 46 U/L (ref 11–51)

## 2016-08-11 MED ORDER — ACETAMINOPHEN 325 MG PO TABS
650.0000 mg | ORAL_TABLET | Freq: Once | ORAL | Status: AC
  Administered 2016-08-11: 650 mg via ORAL
  Filled 2016-08-11: qty 2

## 2016-08-11 MED ORDER — KETOROLAC TROMETHAMINE 30 MG/ML IJ SOLN
30.0000 mg | Freq: Once | INTRAMUSCULAR | Status: AC
  Administered 2016-08-11: 30 mg via INTRAVENOUS
  Filled 2016-08-11: qty 1

## 2016-08-11 MED ORDER — SODIUM CHLORIDE 0.9 % IV SOLN
INTRAVENOUS | Status: DC
  Administered 2016-08-11: 23:00:00 via INTRAVENOUS

## 2016-08-11 MED ORDER — IOPAMIDOL (ISOVUE-300) INJECTION 61%
30.0000 mL | Freq: Once | INTRAVENOUS | Status: AC | PRN
  Administered 2016-08-11: 15 mL via ORAL

## 2016-08-11 NOTE — ED Notes (Signed)
Patient transported to CT 

## 2016-08-11 NOTE — ED Triage Notes (Signed)
Patient c/o of left sided abd pain that has been on going for years.  Patient states that today pain is more severe and has nausea with me.

## 2016-08-11 NOTE — ED Notes (Signed)
Pt given cup for urine sample 

## 2016-08-11 NOTE — ED Notes (Signed)
Pt reports he is shaking and has now developed a severe headache.  Pt also states that he still has flank pain. Dr. Lynelle DoctorKnapp made aware and gave a verbal order of 650mg  tylenol.

## 2016-08-11 NOTE — ED Provider Notes (Signed)
[facility]-EMERGENCY DEPT Provider Note   CSN: [id number] Arrival date & time: 08/11/16  1757     History   Chief Complaint Chief Complaint  Patient presents with  . Abdominal Pain  . Nausea    HPI Rae LipsJody S Tukes is a 40 y.o. male.  HPI Pt has been having pain in his abdomen for the last 14 years.   Over the last couple of months the pain has increased.  In the last couple of days he has had severe increasing pain.  The pain is on the left side.  It is sharp in nature.  Some nausea. No vomiting.   The pain is pulsatile in nature.  No dysuria.  History reviewed. No pertinent past medical history.  Patient Active Problem List   Diagnosis Date Noted  . Abscess and cellulitis 04/17/2014  . Pain in joint, upper arm 04/17/2014  . Febrile illness, acute 04/17/2014  . Leukocytosis, unspecified 04/17/2014  . Severe sepsis (HCC) 04/17/2014    Past Surgical History:  Procedure Laterality Date  . HERNIA REPAIR    . I&D EXTREMITY Right 04/17/2014   Procedure: IRRIGATION AND DEBRIDEMENT RIGHT ARM;  Surgeon: Kathryne Hitchhristopher Y Blackman, MD;  Location: [facility];  Service: Orthopedics;  Laterality: Right;       Home Medications    Prior to Admission medications   Medication Sig Start Date End Date Taking? Authorizing Provider  acetaminophen (TYLENOL) 325 MG tablet Take 2 tablets (650 mg total) by mouth every 6 (six) hours as needed for mild pain (or Fever >/= 101). 04/19/14   Esperanza SheetsUlugbek N Buriev, MD  cephALEXin (KEFLEX) 500 MG capsule Take 1 capsule (500 mg total) by mouth 4 (four) times daily. 04/19/14   Esperanza SheetsUlugbek N Buriev, MD  doxycycline (VIBRA-TABS) 100 MG tablet Take 1 tablet (100 mg total) by mouth 2 (two) times daily. 04/19/14   Esperanza SheetsUlugbek N Buriev, MD  etodolac (LODINE) 300 MG capsule Take 1 capsule (300 mg total) by mouth every 8 (eight) hours. 08/12/16   Linwood DibblesJon Toua Stites, MD  ibuprofen (ADVIL,MOTRIN) 800 MG tablet Take 1 tablet (800 mg total) by mouth every 8 (eight) hours as needed for mild pain or  moderate pain. 05/19/16   Trixie DredgeEmily West, PA-C  methocarbamol (ROBAXIN) 500 MG tablet Take 1-2 tablets (500-1,000 mg total) by mouth every 8 (eight) hours as needed for muscle spasms (and pain). 05/19/16   Trixie DredgeEmily West, PA-C  nicotine (NICODERM CQ - DOSED IN MG/24 HOURS) 21 mg/24hr patch Place 1 patch (21 mg total) onto the skin daily. 04/19/14   Esperanza SheetsUlugbek N Buriev, MD    Family History No family history on file.  Social History Social History  Substance Use Topics  . Smoking status: Current Every Day Smoker    Packs/day: 0.50    Types: Cigarettes  . Smokeless tobacco: Never Used  . Alcohol use No     Allergies   Patient has no known allergies.   Review of Systems Review of Systems  All other systems reviewed and are negative.    Physical Exam Updated Vital Signs BP 124/74 (BP Location: Left Arm)   Pulse 86   Temp 98.6 F (37 C) (Oral)   Resp 18   SpO2 96%   Physical Exam  Constitutional: He appears well-developed and well-nourished. No distress.  HENT:  Head: Normocephalic and atraumatic.  Right Ear: External ear normal.  Left Ear: External ear normal.  Eyes: Conjunctivae are normal. Right eye exhibits no discharge. Left eye exhibits no discharge. No scleral  icterus.  Neck: Neck supple. No tracheal deviation present.  Cardiovascular: Normal rate, regular rhythm and intact distal pulses.   Pulmonary/Chest: Effort normal and breath sounds normal. No stridor. No respiratory distress. He has no wheezes. He has no rales.  Abdominal: Soft. Bowel sounds are normal. He exhibits no distension. There is no tenderness. There is no rebound and no guarding.  Musculoskeletal: He exhibits no edema or tenderness.  Neurological: He is alert. He has normal strength. No cranial nerve deficit (no facial droop, extraocular movements intact, no slurred speech) or sensory deficit. He exhibits normal muscle tone. He displays no seizure activity. Coordination normal.  Skin: Skin is warm and dry. No  rash noted.  Psychiatric: He has a normal mood and affect.  Nursing note and vitals reviewed.    ED Treatments / Results  Labs (all labs ordered are listed, but only abnormal results are displayed) Labs Reviewed  COMPREHENSIVE METABOLIC PANEL - Abnormal; Notable for the following:       Result Value   BUN 21 (*)    All other components within normal limits  CBC - Abnormal; Notable for the following:    HCT 38.6 (*)    All other components within normal limits  LIPASE, BLOOD  URINALYSIS, ROUTINE W REFLEX MICROSCOPIC (NOT AT [facility])    Radiology Ct Abdomen Pelvis Wo Contrast  Result Date: 08/11/2016 CLINICAL DATA:  Left-sided pain ongoing for years, more severe with nausea today. EXAM: CT ABDOMEN AND PELVIS WITHOUT CONTRAST TECHNIQUE: Multidetector CT imaging of the abdomen and pelvis was performed following the standard protocol without IV contrast. COMPARISON:  Seven hundred [place] 2007 FINDINGS: Lower chest: Bibasilar dependent atelectasis. Normal visualized cardiac chambers size. No significant pericardial effusion. Hepatobiliary: No focal liver abnormality is seen. Mild prominence of the left hepatic lobe as before draped over the spleen. No gallstones, gallbladder wall thickening, or biliary dilatation. What is believed be the gallbladder appears diminutive and contracted. Pancreas: Unremarkable. No pancreatic ductal dilatation or surrounding inflammatory changes. Spleen: Normal in size without focal abnormality. Adrenals/Urinary Tract: Normal bilateral adrenal glands. There are nonobstructing calculi in both kidneys measuring up to 2 mm in the interpolar aspect bilaterally as well as right lower pole. Small extrarenal pelves are seen bilaterally. No hydroureteronephrosis. No bladder calcifications. Pelvic phleboliths are seen in the pelvis bilaterally. Stomach/Bowel: The stomach is distended with fluid and contrast. There is normal small bowel rotation. No acute inflammation nor bowel  obstruction noted. Vascular/Lymphatic: No significant vascular findings are present. No enlarged abdominal or pelvic lymph nodes. Reproductive: Prostate is unremarkable. Other: No abdominal wall hernia or abnormality. No abdominopelvic ascites. Musculoskeletal: No acute or significant osseous findings. IMPRESSION: Nonobstructing bilateral renal calculi otherwise negative exam. No findings for the patient's left-sided pain. Electronically Signed   By: Tollie Eth M.D.   On: 08/11/2016 23:39    Procedures Procedures (including critical care time)  Medications Ordered in ED Medications  0.9 %  sodium chloride infusion ( Intravenous New Bag/Given 08/11/16 2308)  ketorolac (TORADOL) 30 MG/ML injection 30 mg (30 mg Intravenous Given 08/11/16 2308)  iopamidol (ISOVUE-300) 61 % injection 30 mL (15 mLs Oral Contrast Given 08/11/16 2250)  acetaminophen (TYLENOL) tablet 650 mg (650 mg Oral Given 08/11/16 2343)     Initial Impression / Assessment and Plan / ED Course  I have reviewed the triage vital signs and the nursing notes.  Pertinent labs & imaging results that were available during my care of the patient were reviewed by me and considered  in my medical decision making (see chart for details).  Clinical Course     No acute findings on CT scan.  Labs are reassuring.  Etiology of his pain is unclear.  Will dc home with nsaids.  Follow up with [facility]  Final Clinical Impressions(s) / ED Diagnoses   Final diagnoses:  Flank pain    New Prescriptions New Prescriptions   ETODOLAC (LODINE) 300 MG CAPSULE    Take 1 capsule (300 mg total) by mouth every 8 (eight) hours.     Linwood DibblesJon Ravan Schlemmer, MD 08/12/16 [phone]

## 2016-08-12 MED ORDER — HYDROXYZINE HCL 25 MG PO TABS
50.0000 mg | ORAL_TABLET | Freq: Once | ORAL | Status: AC
  Administered 2016-08-12: 50 mg via ORAL
  Filled 2016-08-12: qty 2

## 2016-08-12 MED ORDER — ETODOLAC 300 MG PO CAPS: 300.0000 mg | ORAL_CAPSULE | Freq: Three times a day (TID) | ORAL | 0 refills | Status: AC

## 2016-08-12 NOTE — Discharge Instructions (Signed)
Follow-up with a primary care doctor for further evaluation. Laboratory tests and the CT scan tonight did not show any signs of obstruction or other acute abnormality or infection. Take the medications hasn't as needed for pain.

## 2016-08-17 ENCOUNTER — Emergency Department (HOSPITAL_COMMUNITY)
Admission: EM | Admit: 2016-08-17 | Discharge: 2016-08-17 | Disposition: A | Discharge status: Home / Self Care | Payer: Self-pay | Attending: Emergency Medicine | Admitting: Emergency Medicine

## 2016-08-17 ENCOUNTER — Encounter (HOSPITAL_COMMUNITY): Payer: Self-pay | Admitting: Emergency Medicine

## 2016-08-17 DIAGNOSIS — R55 Syncope and collapse: Secondary | ICD-10-CM | POA: Insufficient documentation

## 2016-08-17 DIAGNOSIS — F17210 Nicotine dependence, cigarettes, uncomplicated: Secondary | ICD-10-CM | POA: Insufficient documentation

## 2016-08-17 DIAGNOSIS — Z79899 Other long term (current) drug therapy: Secondary | ICD-10-CM | POA: Insufficient documentation

## 2016-08-17 LAB — CBC
HEMATOCRIT: 42.9 % (ref 39.0–52.0)
HEMOGLOBIN: 14.2 g/dL (ref 13.0–17.0)
MCH: 28.6 pg (ref 26.0–34.0)
MCHC: 33.1 g/dL (ref 30.0–36.0)
MCV: 86.5 fL (ref 78.0–100.0)
Platelets: 231 10*3/uL (ref 150–400)
RBC: 4.96 MIL/uL (ref 4.22–5.81)
RDW: 13.8 % (ref 11.5–15.5)
WBC: 6.7 10*3/uL (ref 4.0–10.5)

## 2016-08-17 LAB — BASIC METABOLIC PANEL
ANION GAP: 11 (ref 5–15)
BUN: 14 mg/dL (ref 6–20)
CO2: 24 mmol/L (ref 22–32)
Calcium: 9.6 mg/dL (ref 8.9–10.3)
Chloride: 105 mmol/L (ref 101–111)
Creatinine, Ser: 0.88 mg/dL (ref 0.61–1.24)
GFR calc Af Amer: >60 mL/min (ref >60)
GFR calc non Af Amer: >60 mL/min (ref >60)
GLUCOSE: 95 mg/dL (ref 65–99)
POTASSIUM: 5.1 mmol/L (ref 3.5–5.1)
Sodium: 140 mmol/L (ref 135–145)

## 2016-08-17 NOTE — ED Notes (Signed)
EDP at bedside  

## 2016-08-17 NOTE — ED Notes (Signed)
Pt stsates he does weekends in jail and he doesn't eat or drink much during the weekends.

## 2016-08-17 NOTE — ED Notes (Signed)
Pt given a bag lunch with water to drink.

## 2016-08-17 NOTE — ED Provider Notes (Signed)
[facility]PT Provider Note   CSN: [id number] Arrival date & time: 08/17/16  [id number]     History   Chief Complaint Chief Complaint  Patient presents with  . Loss of Consciousness    HPI Marcus Rice is a 40 y.o. male who presents emergency Department with chief complaint of syncope. 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 He got into his Body is seen with respirator and got lightheaded and passed out. He did not injure himself from falling. Patient woke up and realized what happened the sand blast containment area. He states he went outside and told his coworkers what happened, and as he was standing up, became lightheaded again and passed out. His coworkers caught him and sat him in a chair. He had no seizure-like activity. Patient states that he is currently serving (time in jail and he is only allowed 4 ounces of fluid per me. All. He also states that he generally doesn't eat very much during the weekend and feels he was probably very dehydrated. Prior to this morning. He denies any blood in his stools. Has a past history of heroin abuse. States he's been clean and sober for several months. He denies fevers, chills, chest pain, racing or skipping in his heart, nausea, vomiting.  HPI  History reviewed. No pertinent past medical history.  Patient Active Problem List   Diagnosis Date Noted  . Abscess and cellulitis 04/17/2014  . Pain in joint, upper arm 04/17/2014  . Febrile illness, acute 04/17/2014  . Leukocytosis, unspecified 04/17/2014  . Severe sepsis (HCC) 04/17/2014    Past Surgical History:  Procedure Laterality Date  . HERNIA REPAIR    . I&D EXTREMITY Right 04/17/2014   Procedure: IRRIGATION AND DEBRIDEMENT RIGHT ARM;  Surgeon: Kathryne Hitchhristopher Y Blackman, MD;  Location: [facility];  Service: Orthopedics;  Laterality: Right;       Home Medications    Prior to Admission medications   Medication Sig Start Date End Date  Taking? Authorizing Provider  acetaminophen (TYLENOL) 325 MG tablet Take 2 tablets (650 mg total) by mouth every 6 (six) hours as needed for mild pain (or Fever >/= 101). 04/19/14   Esperanza SheetsUlugbek N Buriev, MD  cephALEXin (KEFLEX) 500 MG capsule Take 1 capsule (500 mg total) by mouth 4 (four) times daily. 04/19/14   Esperanza SheetsUlugbek N Buriev, MD  doxycycline (VIBRA-TABS) 100 MG tablet Take 1 tablet (100 mg total) by mouth 2 (two) times daily. 04/19/14   Esperanza SheetsUlugbek N Buriev, MD  etodolac (LODINE) 300 MG capsule Take 1 capsule (300 mg total) by mouth every 8 (eight) hours. 08/12/16   Linwood DibblesJon Knapp, MD  ibuprofen (ADVIL,MOTRIN) 800 MG tablet Take 1 tablet (800 mg total) by mouth every 8 (eight) hours as needed for mild pain or moderate pain. 05/19/16   Trixie DredgeEmily West, PA-C  methocarbamol (ROBAXIN) 500 MG tablet Take 1-2 tablets (500-1,000 mg total) by mouth every 8 (eight) hours as needed for muscle spasms (and pain). 05/19/16   Trixie DredgeEmily West, PA-C  nicotine (NICODERM CQ - DOSED IN MG/24 HOURS) 21 mg/24hr patch Place 1 patch (21 mg total) onto the skin daily. 04/19/14   Esperanza SheetsUlugbek N Buriev, MD    Family History No family history on file.  Social History Social History  Substance Use Topics  . Smoking status: Current Every Day Smoker    Packs/day: 0.50    Types: Cigarettes  . Smokeless tobacco: Never Used  . Alcohol use No  Allergies   Patient has no known allergies.   Review of Systems Review of Systems  Ten systems reviewed and are negative for acute change, except as noted in the HPI.   Physical Exam Updated Vital Signs BP 110/78   Pulse 63   Temp 98.6 F (37 C) (Oral)   Resp 14   Ht 5\' 11"  (1.803 m)   Wt 70.3 kg   SpO2 98%   BMI 21.62 kg/m   Physical Exam  Constitutional: He is oriented to person, place, and time. He appears well-developed and well-nourished. No distress.  HENT:  Head: Normocephalic and atraumatic.  Eyes: Conjunctivae are normal. No scleral icterus.  Neck: Normal range of motion. Neck  supple.  Cardiovascular: Normal rate, regular rhythm and normal heart sounds.   Pulmonary/Chest: Effort normal and breath sounds normal. No respiratory distress.  Abdominal: Soft. There is no tenderness.  Musculoskeletal: He exhibits no edema.  Neurological: He is alert and oriented to person, place, and time.  Speech is clear and goal oriented, follows commands Major Cranial nerves without deficit, no facial droop Normal strength in upper and lower extremities bilaterally including dorsiflexion and plantar flexion, strong and equal grip strength Sensation normal to light and sharp touch Moves extremities without ataxia, coordination intact Normal finger to nose and rapid alternating movements Neg romberg, no pronator drift Normal gait Normal heel-shin and balance   Skin: Skin is warm and dry. He is not diaphoretic.  Psychiatric: His behavior is normal.  Nursing note and vitals reviewed.    ED Treatments / Results  Labs (all labs ordered are listed, but only abnormal results are displayed) Labs Reviewed  BASIC METABOLIC PANEL  CBC  URINALYSIS, ROUTINE W REFLEX MICROSCOPIC (NOT AT [facility])  CBG MONITORING, ED    EKG  EKG Interpretation None       Radiology No results found.  Procedures Procedures (including critical care time)  Medications Ordered in ED Medications - No data to display   Initial Impression / Assessment and Plan / ED Course  I have reviewed the triage vital signs and the nursing notes.  Pertinent labs & imaging results that were available during my care of the patient were reviewed by me and considered in my medical decision making (see chart for details).  Clinical Course as of Aug 17 2010  Mon Aug 17, 2016  1256 Patient blood work without abnormality. EKG is normal. At arrival, but have improved. Patient drank 1 L of fluid. He ate. He is ambulatory without dizziness. Appears safe for discharge at this time.  [AH]    Clinical Course User  Index [AH] Arthor CaptainAbigail Brandey Vandalen, PA-C      Final Clinical Impressions(s) / ED Diagnoses   Final diagnoses:  None    New Prescriptions New Prescriptions   No medications on file     Arthor Captainbigail Bernardina Cacho, PA-C 08/17/16 2011    Bethann BerkshireJoseph Zammit, MD 08/18/16 1511

## 2016-08-17 NOTE — Discharge Instructions (Signed)
Get help right away if: °You have a severe headache. °You have unusual pain in your chest, abdomen, or back. °You are bleeding from your mouth or rectum, or you have black or tarry stool. °You have a very fast or irregular heartbeat (palpitations). °You have pain with breathing. °You faint once or repeatedly. °You have a seizure. °You are confused. °You have trouble walking. °You have severe weakness. °You have vision problems. °

## 2016-08-17 NOTE — ED Triage Notes (Signed)
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 Pt states he got up and went and sat down and drink some coffee and when he stood back up he states he had another syncope episodes.

## 2016-08-17 NOTE — ED Notes (Signed)
Pt ambulated with no difficulty

## 2016-08-21 ENCOUNTER — Encounter (HOSPITAL_COMMUNITY): Payer: Self-pay | Admitting: Nurse Practitioner

## 2016-08-21 ENCOUNTER — Emergency Department (HOSPITAL_COMMUNITY)
Admission: EM | Admit: 2016-08-21 | Discharge: 2016-08-21 | Disposition: A | Discharge status: Home / Self Care | Payer: No Typology Code available for payment source | Attending: Emergency Medicine | Admitting: Emergency Medicine

## 2016-08-21 DIAGNOSIS — R55 Syncope and collapse: Secondary | ICD-10-CM | POA: Insufficient documentation

## 2016-08-21 DIAGNOSIS — F17210 Nicotine dependence, cigarettes, uncomplicated: Secondary | ICD-10-CM | POA: Insufficient documentation

## 2016-08-21 DIAGNOSIS — Y999 Unspecified external cause status: Secondary | ICD-10-CM | POA: Diagnosis not present

## 2016-08-21 DIAGNOSIS — R51 Headache: Secondary | ICD-10-CM | POA: Diagnosis not present

## 2016-08-21 DIAGNOSIS — Z5321 Procedure and treatment not carried out due to patient leaving prior to being seen by health care provider: Secondary | ICD-10-CM | POA: Diagnosis not present

## 2016-08-21 DIAGNOSIS — M25512 Pain in left shoulder: Secondary | ICD-10-CM | POA: Insufficient documentation

## 2016-08-21 DIAGNOSIS — Y9389 Activity, other specified: Secondary | ICD-10-CM | POA: Diagnosis not present

## 2016-08-21 DIAGNOSIS — Y92480 Sidewalk as the place of occurrence of the external cause: Secondary | ICD-10-CM | POA: Diagnosis not present

## 2016-08-21 DIAGNOSIS — W1830XA Fall on same level, unspecified, initial encounter: Secondary | ICD-10-CM | POA: Insufficient documentation

## 2016-08-21 LAB — URINALYSIS, ROUTINE W REFLEX MICROSCOPIC
BILIRUBIN URINE: NEGATIVE
GLUCOSE, UA: NEGATIVE mg/dL
HGB URINE DIPSTICK: NEGATIVE
Ketones, ur: NEGATIVE mg/dL
Leukocytes, UA: NEGATIVE
Nitrite: NEGATIVE
PH: 8 (ref 5.0–8.0)
Protein, ur: NEGATIVE mg/dL
SPECIFIC GRAVITY, URINE: 1.019 (ref 1.005–1.030)

## 2016-08-21 LAB — CBC
HEMATOCRIT: 37.3 % — AB (ref 39.0–52.0)
HEMOGLOBIN: 12.8 g/dL — AB (ref 13.0–17.0)
MCH: 29.1 pg (ref 26.0–34.0)
MCHC: 34.3 g/dL (ref 30.0–36.0)
MCV: 84.8 fL (ref 78.0–100.0)
Platelets: 250 10*3/uL (ref 150–400)
RBC: 4.4 MIL/uL (ref 4.22–5.81)
RDW: 13.6 % (ref 11.5–15.5)
WBC: 8.5 10*3/uL (ref 4.0–10.5)

## 2016-08-21 LAB — BASIC METABOLIC PANEL
ANION GAP: 10 (ref 5–15)
BUN: 17 mg/dL (ref 6–20)
CALCIUM: 9.2 mg/dL (ref 8.9–10.3)
CO2: 25 mmol/L (ref 22–32)
Chloride: 103 mmol/L (ref 101–111)
Creatinine, Ser: 0.98 mg/dL (ref 0.61–1.24)
GFR calc Af Amer: >60 mL/min (ref >60)
GFR calc non Af Amer: >60 mL/min (ref >60)
GLUCOSE: 89 mg/dL (ref 65–99)
POTASSIUM: 4 mmol/L (ref 3.5–5.1)
Sodium: 138 mmol/L (ref 135–145)

## 2016-08-21 NOTE — ED Triage Notes (Signed)
Pt presents with c/o syncope. He's had a headache all day. He went to the drugstore and bought a monster drink hoping that the caffeine would help his headache. After drinking several sips, he felt dizzy and began to have tunnel vision then lost consciousness, falling onto sidewalk. He woke on his own after several minutes. He c/o L shoulder pain after the fall. He continues to have a headache.

## 2016-08-21 NOTE — ED Notes (Signed)
Pt states he needs to leave in order to catch the bus home and return to work. Pt alert, orientedx4, steady gait, seen leaving ED.

## 2016-10-22 ENCOUNTER — Emergency Department (HOSPITAL_COMMUNITY)
Admission: EM | Admit: 2016-10-22 | Discharge: 2016-10-22 | Disposition: A | Discharge status: Home / Self Care | Payer: Self-pay | Attending: Emergency Medicine | Admitting: Emergency Medicine

## 2016-10-22 ENCOUNTER — Encounter (HOSPITAL_COMMUNITY): Payer: Self-pay | Admitting: Emergency Medicine

## 2016-10-22 DIAGNOSIS — F17210 Nicotine dependence, cigarettes, uncomplicated: Secondary | ICD-10-CM | POA: Insufficient documentation

## 2016-10-22 DIAGNOSIS — F1910 Other psychoactive substance abuse, uncomplicated: Secondary | ICD-10-CM | POA: Insufficient documentation

## 2016-10-22 DIAGNOSIS — Z79899 Other long term (current) drug therapy: Secondary | ICD-10-CM | POA: Insufficient documentation

## 2016-10-22 LAB — CBC WITH DIFFERENTIAL/PLATELET
BASOS PCT: 0 %
Basophils Absolute: 0 10*3/uL (ref 0.0–0.1)
EOS ABS: 0.1 10*3/uL (ref 0.0–0.7)
EOS PCT: 1 %
HCT: 40.3 % (ref 39.0–52.0)
Hemoglobin: 13.9 g/dL (ref 13.0–17.0)
LYMPHS ABS: 0.9 10*3/uL (ref 0.7–4.0)
Lymphocytes Relative: 10 %
MCH: 28.9 pg (ref 26.0–34.0)
MCHC: 34.5 g/dL (ref 30.0–36.0)
MCV: 83.8 fL (ref 78.0–100.0)
MONO ABS: 1 10*3/uL (ref 0.1–1.0)
MONOS PCT: 10 %
NEUTROS PCT: 79 %
Neutro Abs: 7.3 10*3/uL (ref 1.7–7.7)
PLATELETS: 177 10*3/uL (ref 150–400)
RBC: 4.81 MIL/uL (ref 4.22–5.81)
RDW: 14 % (ref 11.5–15.5)
WBC: 9.3 10*3/uL (ref 4.0–10.5)

## 2016-10-22 LAB — BASIC METABOLIC PANEL
Anion gap: 11 (ref 5–15)
BUN: 14 mg/dL (ref 6–20)
CALCIUM: 9.6 mg/dL (ref 8.9–10.3)
CHLORIDE: 105 mmol/L (ref 101–111)
CO2: 23 mmol/L (ref 22–32)
CREATININE: 0.75 mg/dL (ref 0.61–1.24)
GFR calc non Af Amer: >60 mL/min (ref >60)
Glucose, Bld: 116 mg/dL — ABNORMAL HIGH (ref 65–99)
Potassium: 3.7 mmol/L (ref 3.5–5.1)
SODIUM: 139 mmol/L (ref 135–145)

## 2016-10-22 LAB — I-STAT CG4 LACTIC ACID, ED: LACTIC ACID, VENOUS: 1.56 mmol/L (ref 0.5–1.9)

## 2016-10-22 MED ORDER — SODIUM CHLORIDE 0.9 % IV BOLUS (SEPSIS): 1000.0000 mL | Freq: Once | INTRAVENOUS | Status: DC

## 2016-10-22 MED ORDER — LORAZEPAM 2 MG/ML IJ SOLN
0.5000 mg | Freq: Once | INTRAMUSCULAR | Status: DC
  Filled 2016-10-22: qty 1

## 2016-10-22 MED ORDER — LORAZEPAM 2 MG/ML IJ SOLN: 1.0000 mg | Freq: Once | INTRAMUSCULAR | Status: DC

## 2016-10-22 MED ORDER — LORAZEPAM 2 MG/ML IJ SOLN
1.0000 mg | Freq: Once | INTRAMUSCULAR | Status: AC
  Administered 2016-10-22: 1 mg via INTRAMUSCULAR

## 2016-10-22 MED ORDER — LORAZEPAM 1 MG PO TABS: 1.0000 mg | ORAL_TABLET | Freq: Once | ORAL | Status: DC

## 2016-10-22 NOTE — ED Notes (Signed)
Patient refusing to keep monitoring equipment on 

## 2016-10-22 NOTE — ED Notes (Signed)
Pt reports last use of heroin was yesterday evening. Pt reports he is "burning" all over. Dr. Juleen ChinaKohut at bedside

## 2016-10-22 NOTE — ED Notes (Signed)
Gave pt water per [place]-RN.

## 2016-10-22 NOTE — ED Notes (Signed)
Law enforcement remains at bedside with patient at this time

## 2016-10-22 NOTE — ED Triage Notes (Signed)
Pt arrives via [facility], ems reports pt was in car with bail bondsman being transported when he began shaking. Pt reports using heroin this am. Pt was pepper sprayed pta. Pt diaphoretic and anxious at this time.

## 2016-10-22 NOTE — ED Notes (Signed)
Law enforcement at bedside reports that when they found patient earlier this morning he was hiding in a closet, pt then began screaming after he was placed into the car to be transported.

## 2016-10-22 NOTE — ED Provider Notes (Signed)
[facility] Provider Note   CSN: [id number] Arrival date & time: 10/22/16  7829     History   Chief Complaint Chief Complaint  Patient presents with  . Drug Overdose    HPI Marcus Rice is a 41 y.o. male.  The history is provided by the patient and medical records. No language interpreter was used.  Drug Overdose    Marcus Rice is a 41 y.o. male  with hx of substance abuse who presents to the Emergency Department by EMS with bail bondsman present. Per bail bondsman at bedside, they went into the home and patient was hiding in the closet. He was quiet and calm at the time. Patient was found and became very agitated and uncooperative. Bondsman sprayed pepper-spray into his eyes and had to use taser to restrain patient. Handcuffs were placed and they were in transport to jail. While being transported, patient began yelling and stating that he needed medical attention. He was complaining of being hot and thirsty, therefore they stopped on the side of the road and gave him water from a water hose. He continued to say he was hot, so they put his head under the water and continued to ER. Upon arrival to ER, patient is writhing around the bed grunting, but will not respond to my questions, therefore limiting history.   Level V caveat 2/2 patient compliance, nonverbal.  History reviewed. No pertinent past medical history.  Patient Active Problem List   Diagnosis Date Noted  . Abscess and cellulitis 04/17/2014  . Pain in joint, upper arm 04/17/2014  . Febrile illness, acute 04/17/2014  . Leukocytosis, unspecified 04/17/2014  . Severe sepsis (HCC) 04/17/2014    Past Surgical History:  Procedure Laterality Date  . HERNIA REPAIR    . I&D EXTREMITY Right 04/17/2014   Procedure: IRRIGATION AND DEBRIDEMENT RIGHT ARM;  Surgeon: Kathryne Hitch, MD;  Location: [facility];  Service: Orthopedics;  Laterality: Right;       Home Medications    Prior to Admission medications     Medication Sig Start Date End Date Taking? Authorizing Provider  acetaminophen (TYLENOL) 325 MG tablet Take 2 tablets (650 mg total) by mouth every 6 (six) hours as needed for mild pain (or Fever >/= 101). 04/19/14   Esperanza Sheets, MD  cephALEXin (KEFLEX) 500 MG capsule Take 1 capsule (500 mg total) by mouth 4 (four) times daily. Patient not taking: Reported on 08/17/2016 04/19/14   Esperanza Sheets, MD  doxycycline (VIBRA-TABS) 100 MG tablet Take 1 tablet (100 mg total) by mouth 2 (two) times daily. Patient not taking: Reported on 08/17/2016 04/19/14   Esperanza Sheets, MD  etodolac (LODINE) 300 MG capsule Take 1 capsule (300 mg total) by mouth every 8 (eight) hours. Patient not taking: Reported on 08/17/2016 08/12/16   Linwood Dibbles, MD  ibuprofen (ADVIL,MOTRIN) 800 MG tablet Take 1 tablet (800 mg total) by mouth every 8 (eight) hours as needed for mild pain or moderate pain. 05/19/16   Trixie Dredge, PA-C  methocarbamol (ROBAXIN) 500 MG tablet Take 1-2 tablets (500-1,000 mg total) by mouth every 8 (eight) hours as needed for muscle spasms (and pain). Patient not taking: Reported on 08/17/2016 05/19/16   Trixie Dredge, PA-C  nicotine (NICODERM CQ - DOSED IN MG/24 HOURS) 21 mg/24hr patch Place 1 patch (21 mg total) onto the skin daily. Patient not taking: Reported on 08/17/2016 04/19/14   Esperanza Sheets, MD    Family History No family history  on file.  Social History Social History  Substance Use Topics  . Smoking status: Current Every Day Smoker    Packs/day: 0.50    Types: Cigarettes  . Smokeless tobacco: Never Used  . Alcohol use No     Allergies   Patient has no known allergies.   Review of Systems Review of Systems  Unable to perform ROS: Other     Physical Exam Updated Vital Signs BP 102/89   Pulse 67   Temp (S) (!) 95.1 F (35.1 C) (Rectal)   Resp 18   SpO2 98%   Physical Exam  Constitutional: He is oriented to person, place, and time. He appears well-developed and  well-nourished. No distress.  HENT:  Head: Normocephalic and atraumatic.  Cardiovascular: Normal rate, regular rhythm and normal heart sounds.   No murmur heard. Pulmonary/Chest: Effort normal and breath sounds normal. No respiratory distress. He has no wheezes. He has no rales.  Abdominal: Soft. He exhibits no distension. There is no tenderness.  Musculoskeletal: Normal range of motion.  Neurological: He is alert and oriented to person, place, and time.  Skin: Skin is warm. Capillary refill takes less than 2 seconds. No rash noted.  Nursing note and vitals reviewed.    ED Treatments / Results  Labs (all labs ordered are listed, but only abnormal results are displayed) Labs Reviewed  CBC WITH DIFFERENTIAL/PLATELET  BASIC METABOLIC PANEL    EKG  EKG Interpretation  Date/Time:  Thursday October 22 2016 10:07:00 EST Ventricular Rate:  70 PR Interval:    QRS Duration: 104 QT Interval:  452 QTC Calculation: 488 R Axis:   85 Text Interpretation:  Sinus or ectopic atrial rhythm * Poor data quality, interpretation may be adversely affected Confirmed by Juleen ChinaKOHUT  MD, STEPHEN ([id number]) on 10/22/2016 10:43:29 AM       Radiology No results found.  Procedures Procedures (including critical care time)  Medications Ordered in ED Medications  sodium chloride 0.9 % bolus 1,000 mL (not administered)  LORazepam (ATIVAN) injection 1 mg (1 mg Intramuscular Given 10/22/16 1123)     Initial Impression / Assessment and Plan / ED Course  I have reviewed the triage vital signs and the nursing notes.  Pertinent labs & imaging results that were available during my care of the patient were reviewed by me and considered in my medical decision making (see chart for details).     Marcus Rice is a 41 y.o. male who presents to ED under arrest requesting medical treatment. He will not answer my questions nor will he state why he needs medical care. Per bondsman at bedside, patient was quiet and calm  while hiding in his closet earlier today. He suddenly became very agitated and complaining of being hot and thirsty while being transported to jail. Apparently at some point he told EMS he was withdrawing from heroin, however he did not mention this to me. On exam, patient has temperature of 95 - he was put under water hose prior to arrival after complaining of being warm. ? If this is contributory.  Heart rate and BP reassuring. Lactic normal. White count  / cbc wdl. BMP reassuring. Patient's temperature rechecked after labs and 97.5. He is now cooperating with exam, following commands appropriately and speaking without dysarthria. Medically cleared and safe for discharge to jail.   Patient seen by and discussed with Dr. Juleen ChinaKohut who agrees with treatment plan.    Final Clinical Impressions(s) / ED Diagnoses   Final diagnoses:  None  New Prescriptions New Prescriptions   No medications on file     [facility], PA-C 10/22/16 1526    Raeford Razor, MD 11/04/16 1258

## 2016-10-22 NOTE — ED Notes (Signed)
Pt refusing to hold still for this RN to attempt an iv on patient. Pt reports he is unable to hold still due to his skin burning

## 2016-10-22 NOTE — ED Notes (Signed)
Pt very uncooperative, will not lie still for staff, pt refuses to answer any questions.

## 2016-10-22 NOTE — ED Notes (Signed)
Discharge paperwork given to law enforcement at bedside

## 2016-10-22 NOTE — ED Notes (Signed)
Patient provided paper scrubs at discharge

## 2016-10-22 NOTE — ED Notes (Signed)
EKG given to Dr. Kohut 

## 2016-10-22 NOTE — Discharge Instructions (Signed)
Return to ER for new or worsening symptoms, any additional concerns.  °

## 2017-06-12 ENCOUNTER — Encounter (HOSPITAL_BASED_OUTPATIENT_CLINIC_OR_DEPARTMENT_OTHER): Payer: Self-pay | Admitting: Emergency Medicine

## 2017-06-12 ENCOUNTER — Emergency Department (HOSPITAL_BASED_OUTPATIENT_CLINIC_OR_DEPARTMENT_OTHER): Payer: Self-pay

## 2017-06-12 ENCOUNTER — Emergency Department (HOSPITAL_BASED_OUTPATIENT_CLINIC_OR_DEPARTMENT_OTHER)
Admission: EM | Admit: 2017-06-12 | Discharge: 2017-06-13 | Disposition: A | Discharge status: Home / Self Care | Payer: Self-pay | Attending: Emergency Medicine | Admitting: Emergency Medicine

## 2017-06-12 DIAGNOSIS — L02519 Cutaneous abscess of unspecified hand: Secondary | ICD-10-CM

## 2017-06-12 DIAGNOSIS — L03113 Cellulitis of right upper limb: Secondary | ICD-10-CM | POA: Insufficient documentation

## 2017-06-12 DIAGNOSIS — L02511 Cutaneous abscess of right hand: Secondary | ICD-10-CM | POA: Insufficient documentation

## 2017-06-12 DIAGNOSIS — F17210 Nicotine dependence, cigarettes, uncomplicated: Secondary | ICD-10-CM | POA: Insufficient documentation

## 2017-06-12 DIAGNOSIS — L03119 Cellulitis of unspecified part of limb: Secondary | ICD-10-CM

## 2017-06-12 DIAGNOSIS — Z79899 Other long term (current) drug therapy: Secondary | ICD-10-CM | POA: Insufficient documentation

## 2017-06-12 NOTE — ED Provider Notes (Signed)
[facility]   CSN: [id number] Arrival date & time: 06/12/17  2141     History   Chief Complaint Chief Complaint  Patient presents with  . Rash    HPI Marcus Rice is a 41 y.o. male.  HPI  This is a 41 year old male with a history of polysubstance abuse who presents with rash. Patient reports one month history of rash over the bilateral lower extremities. He states that it started with getting pricked by briars outside. He reports that since that time he has had multiple small wounds that have become pustules and drained purulent material. He reports chills but no documented fevers. Over last 24 hours he has had increasing right hand swelling specifically over the thumb. Just prior to arrival he took a pin and tried to "pop" the lesion at his thumb. He denies any nausea, vomiting, diarrhea, abdominal pain, chest pain, shortness of breath. Initially, patient denies any recent IV drug use; however, after further discussion, he does admit to IV drug use 4 days ago prior to the right hand swelling. He also reports history which is reflected in his chart.  History reviewed. No pertinent past medical history.  Patient Active Problem List   Diagnosis Date Noted  . Abscess and cellulitis 04/17/2014  . Pain in joint, upper arm 04/17/2014  . Febrile illness, acute 04/17/2014  . Leukocytosis, unspecified 04/17/2014  . Severe sepsis (HCC) 04/17/2014    Past Surgical History:  Procedure Laterality Date  . HERNIA REPAIR    . I&D EXTREMITY Right 04/17/2014   Procedure: IRRIGATION AND DEBRIDEMENT RIGHT ARM;  Surgeon: Kathryne Hitch, MD;  Location: [facility];  Service: Orthopedics;  Laterality: Right;       Home Medications    Prior to Admission medications   Medication Sig Start Date End Date Taking? Authorizing Provider  acetaminophen (TYLENOL) 325 MG tablet Take 2 tablets (650 mg total) by mouth every 6 (six) hours as needed for mild pain (or Fever >/=  101). Patient not taking: Reported on 10/22/2016 04/19/14   Esperanza Sheets, MD  cephALEXin (KEFLEX) 500 MG capsule Take 1 capsule (500 mg total) by mouth 4 (four) times daily. Patient not taking: Reported on 08/17/2016 04/19/14   Esperanza Sheets, MD  doxycycline (VIBRA-TABS) 100 MG tablet Take 1 tablet (100 mg total) by mouth 2 (two) times daily. Patient not taking: Reported on 08/17/2016 04/19/14   Esperanza Sheets, MD  etodolac (LODINE) 300 MG capsule Take 1 capsule (300 mg total) by mouth every 8 (eight) hours. Patient not taking: Reported on 08/17/2016 08/12/16   Linwood Dibbles, MD  ibuprofen (ADVIL,MOTRIN) 800 MG tablet Take 1 tablet (800 mg total) by mouth every 8 (eight) hours as needed for mild pain or moderate pain. Patient not taking: Reported on 10/22/2016 05/19/16   Trixie Dredge, PA-C  methocarbamol (ROBAXIN) 500 MG tablet Take 1-2 tablets (500-1,000 mg total) by mouth every 8 (eight) hours as needed for muscle spasms (and pain). Patient not taking: Reported on 08/17/2016 05/19/16   Trixie Dredge, PA-C  nicotine (NICODERM CQ - DOSED IN MG/24 HOURS) 21 mg/24hr patch Place 1 patch (21 mg total) onto the skin daily. Patient not taking: Reported on 08/17/2016 04/19/14   Esperanza Sheets, MD    Family History No family history on file.  Social History Social History  Substance Use Topics  . Smoking status: Current Every Day Smoker    Packs/day: 0.50    Types: Cigarettes  .  Smokeless tobacco: Never Used  . Alcohol use No     Allergies   Patient has no known allergies.   Review of Systems Review of Systems  Constitutional: Positive for chills. Negative for fever.  Respiratory: Negative for shortness of breath.   Cardiovascular: Negative for chest pain.  Gastrointestinal: Negative for abdominal pain, nausea and vomiting.  Skin: Positive for color change and rash.  All other systems reviewed and are negative.    Physical Exam Updated Vital Signs BP (!) 146/86 (BP Location:  Left Arm)   Pulse 94   Temp 98.4 F (36.9 C) (Oral)   Resp 18   Ht  (1.803 m)   Wt 72.6 kg (160 lb)   SpO2 100%   BMI 22.32 kg/m   Physical Exam  Constitutional: He is oriented to person, place, and time.  Disheveled appearing, no acute distress  HENT:  Head: Normocephalic and atraumatic.  Eyes: Pupils are equal, round, and reactive to light.  Cardiovascular: Normal rate, regular rhythm and normal heart sounds.   No murmur heard. Pulmonary/Chest: Effort normal and breath sounds normal. No respiratory distress. He has no wheezes.  Abdominal: Soft. Bowel sounds are normal. There is no tenderness. There is no rebound.  Musculoskeletal: He exhibits no edema.  Swelling of the right hand extending proximally into the forearm with associated erythema, there is fluctuance and induration at the base of the first digit, unable to extend at that digit  Neurological: He is alert and oriented to person, place, and time.  Skin: Skin is warm and dry. Rash noted.  Multiple healing scabbed lesions over the bilateral lower extremities with mild associated erythema, no overt infections  Psychiatric: He has a normal mood and affect.  Nursing note and vitals reviewed.        ED Treatments / Results  Labs (all labs ordered are listed, but only abnormal results are displayed) Labs Reviewed  CBC WITH DIFFERENTIAL/PLATELET - Abnormal; Notable for the following:       Result Value   WBC 13.7 (*)    RBC 4.16 (*)    Hemoglobin 12.5 (*)    HCT 36.1 (*)    Neutro Abs 11.1 (*)    Monocytes Absolute 1.5 (*)    All other components within normal limits  CULTURE, BLOOD (ROUTINE X 2)  CULTURE, BLOOD (ROUTINE X 2)  RAPID URINE DRUG SCREEN, HOSP PERFORMED  COMPREHENSIVE METABOLIC PANEL    EKG  EKG Interpretation None       Radiology Dg Hand Complete Right  Result Date: 06/12/2017 CLINICAL DATA:  Right hand swelling. EXAM: RIGHT HAND - COMPLETE 3+ VIEW COMPARISON:  None. FINDINGS: No  acute fracture. Remote fifth metacarpal fracture. Normal alignment and joint spaces. No periosteal reaction or bony destructive change. Diffuse soft tissue edema with the dorsum of the hand. No soft tissue air or radiopaque foreign body. IMPRESSION: Dorsal soft tissue edema without acute osseous abnormality. No soft tissue air or radiopaque foreign body. Electronically Signed   By: Rubye Oaks M.D.   On: 06/12/2017 23:49    Procedures Procedures (including critical care time)  Medications Ordered in ED Medications  vancomycin (VANCOCIN) IVPB 1000 mg/200 mL premix (not administered)     Initial Impression / Assessment and Plan / ED Course  I have reviewed the triage vital signs and the nursing notes.  Pertinent labs & imaging results that were available during my care of the patient were reviewed by me and considered in my medical decision  making (see chart for details).     Patient presents with swelling and redness of the right hand as well as one month history of rash. He ultimately endorsed IV drug use. High suspicion for bacteremia and cellulitis and abscess of the right hand. Initially afebrile. Repeat temperature 103. Basic labwork was obtained. Lactate was added with repeat temperature. Leukocytosis to 13.7. No osteomyelitis on hand x-ray. Patient was given IV vancomycin. Recommend admission and hand surgery evaluation for incision and drainage of the right hand. I discussed at length this recommendation with the patient. Patient is adamant that he has to go home and take care of work. He understands that if he leaves without definitive management, he risks worsening infection, loss of hand, or even death. Patient verbalizes these risks and accepts them. I have implored him to seek additional treatment once he gets his work in order. I will discharge him with antibiotics.    Final Clinical Impressions(s) / ED Diagnoses   Final diagnoses:  Cellulitis and abscess of hand    New  Prescriptions New Prescriptions   No medications on file     Shon Baton, MD 06/13/17 [phone]

## 2017-06-12 NOTE — ED Triage Notes (Signed)
PT presents with c/o rash all over body and right thumb swollen red and and painful

## 2017-06-12 NOTE — ED Notes (Signed)
To CT

## 2017-06-12 NOTE — ED Notes (Signed)
EDP into room, prior to RN assessment, see MD notes, pending orders.   

## 2017-06-12 NOTE — ED Notes (Signed)
Back from CT, not in room, pt in b/r. Alert, NAD, calm, interactive, resps e/u, speaking in clear complete sentences, no dyspnea noted.

## 2017-06-13 LAB — RAPID URINE DRUG SCREEN, HOSP PERFORMED
AMPHETAMINES: NOT DETECTED
BARBITURATES: NOT DETECTED
Benzodiazepines: NOT DETECTED
Cocaine: POSITIVE — AB
Opiates: POSITIVE — AB
TETRAHYDROCANNABINOL: POSITIVE — AB

## 2017-06-13 LAB — COMPREHENSIVE METABOLIC PANEL
ALBUMIN: 3.7 g/dL (ref 3.5–5.0)
ALT: 18 U/L (ref 17–63)
ANION GAP: 8 (ref 5–15)
AST: 28 U/L (ref 15–41)
Alkaline Phosphatase: 94 U/L (ref 38–126)
BILIRUBIN TOTAL: 0.6 mg/dL (ref 0.3–1.2)
BUN: 7 mg/dL (ref 6–20)
CHLORIDE: 97 mmol/L — AB (ref 101–111)
CO2: 30 mmol/L (ref 22–32)
Calcium: 8.4 mg/dL — ABNORMAL LOW (ref 8.9–10.3)
Creatinine, Ser: 0.94 mg/dL (ref 0.61–1.24)
GFR calc Af Amer: >60 mL/min (ref >60)
GLUCOSE: 68 mg/dL (ref 65–99)
POTASSIUM: 3 mmol/L — AB (ref 3.5–5.1)
Sodium: 135 mmol/L (ref 135–145)
TOTAL PROTEIN: 7.1 g/dL (ref 6.5–8.1)

## 2017-06-13 LAB — CBC WITH DIFFERENTIAL/PLATELET
BASOS PCT: 0 %
Basophils Absolute: 0 10*3/uL (ref 0.0–0.1)
EOS ABS: 0.2 10*3/uL (ref 0.0–0.7)
Eosinophils Relative: 1 %
HCT: 36.1 % — ABNORMAL LOW (ref 39.0–52.0)
HEMOGLOBIN: 12.5 g/dL — AB (ref 13.0–17.0)
LYMPHS ABS: 0.9 10*3/uL (ref 0.7–4.0)
Lymphocytes Relative: 7 %
MCH: 30 pg (ref 26.0–34.0)
MCHC: 34.6 g/dL (ref 30.0–36.0)
MCV: 86.8 fL (ref 78.0–100.0)
MONOS PCT: 11 %
Monocytes Absolute: 1.5 10*3/uL — ABNORMAL HIGH (ref 0.1–1.0)
NEUTROS ABS: 11.1 10*3/uL — AB (ref 1.7–7.7)
NEUTROS PCT: 81 %
Platelets: 334 10*3/uL (ref 150–400)
RBC: 4.16 MIL/uL — AB (ref 4.22–5.81)
RDW: 13.2 % (ref 11.5–15.5)
WBC: 13.7 10*3/uL — AB (ref 4.0–10.5)

## 2017-06-13 LAB — I-STAT CG4 LACTIC ACID, ED: LACTIC ACID, VENOUS: 0.95 mmol/L (ref 0.5–1.9)

## 2017-06-13 MED ORDER — ACETAMINOPHEN 500 MG PO TABS
1000.0000 mg | ORAL_TABLET | Freq: Once | ORAL | Status: AC
  Administered 2017-06-13: 1000 mg via ORAL

## 2017-06-13 MED ORDER — ACETAMINOPHEN 500 MG PO TABS
ORAL_TABLET | ORAL | Status: AC
  Administered 2017-06-13: 1000 mg via ORAL
  Filled 2017-06-13: qty 2

## 2017-06-13 MED ORDER — DOXYCYCLINE HYCLATE 100 MG PO CAPS: 100.0000 mg | ORAL_CAPSULE | Freq: Two times a day (BID) | ORAL | 0 refills | Status: DC

## 2017-06-13 MED ORDER — VANCOMYCIN HCL IN DEXTROSE 1-5 GM/200ML-% IV SOLN
1000.0000 mg | Freq: Once | INTRAVENOUS | Status: AC
  Administered 2017-06-13: 1000 mg via INTRAVENOUS
  Filled 2017-06-13: qty 200

## 2017-06-13 NOTE — ED Notes (Signed)
Pt adamant about needing to leave to be at work in the morning, states, he "needs to coordinate for replacement and pass off keys, he will lose his job otherwise".   Dr. Wilkie Aye explicitly clear in beseeching pt to stay for tx, antibiotics, and transfer for I&D by specialist. Risk of death and loss of limb explained. Pt verbalizes understanding and reiterates his plan to return later in the morning.

## 2017-06-13 NOTE — Discharge Instructions (Signed)
You were seen today for right hand infection. There is concern for abscess and deep space infection. It was recommended to be seen by hand surgeon and have debridement.  Because you elected to go home AGAINST MEDICAL ADVICE, you will be given antibiotics. Please follow-up at [facility] as soon as possible for further management. He may also return here if unable to go to [facility] and we will arrange transfer. If you do not seek further treatment, you could get more sick, leisure hand to infection, or worse, die.

## 2017-06-13 NOTE — ED Notes (Signed)
Pt choosing to lie on L side with arm superior. Pt took arm out of foam elevator.

## 2017-06-18 LAB — CULTURE, BLOOD (ROUTINE X 2)
CULTURE: NO GROWTH
Culture: NO GROWTH

## 2019-05-23 IMAGING — CR DG HAND COMPLETE 3+V*R*
3 series · 3 of 3 positions shown · non-contrast
Comparison: None.

CLINICAL DATA: Right hand swelling.

EXAM:
RIGHT HAND - COMPLETE 3+ VIEW

[x hand pa right]
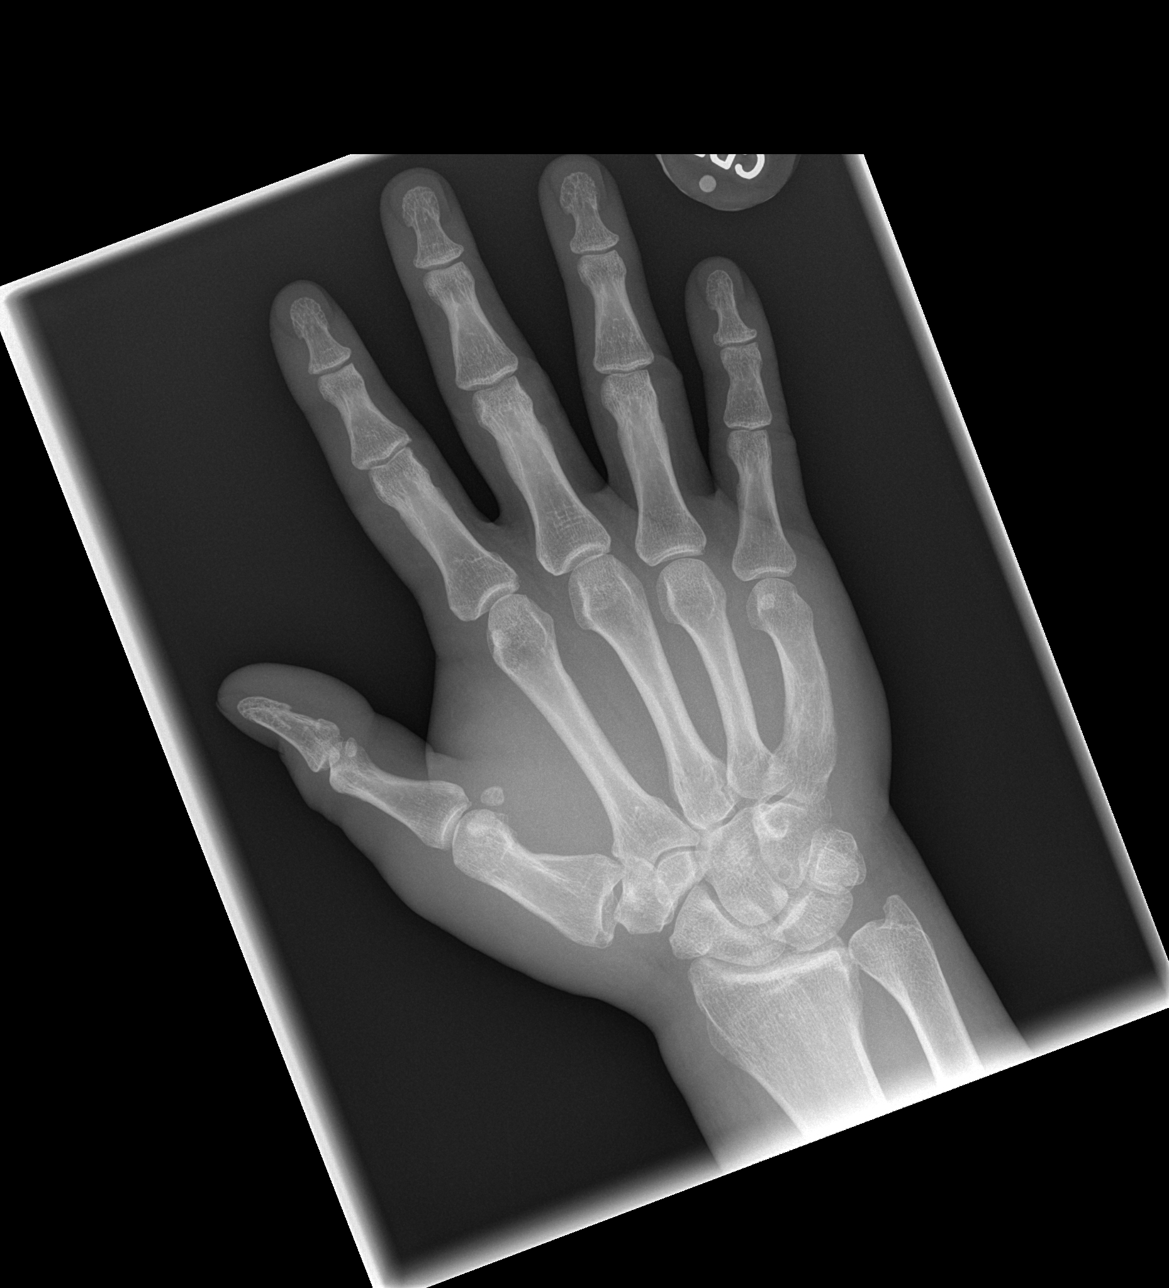

[x hand oblique right]
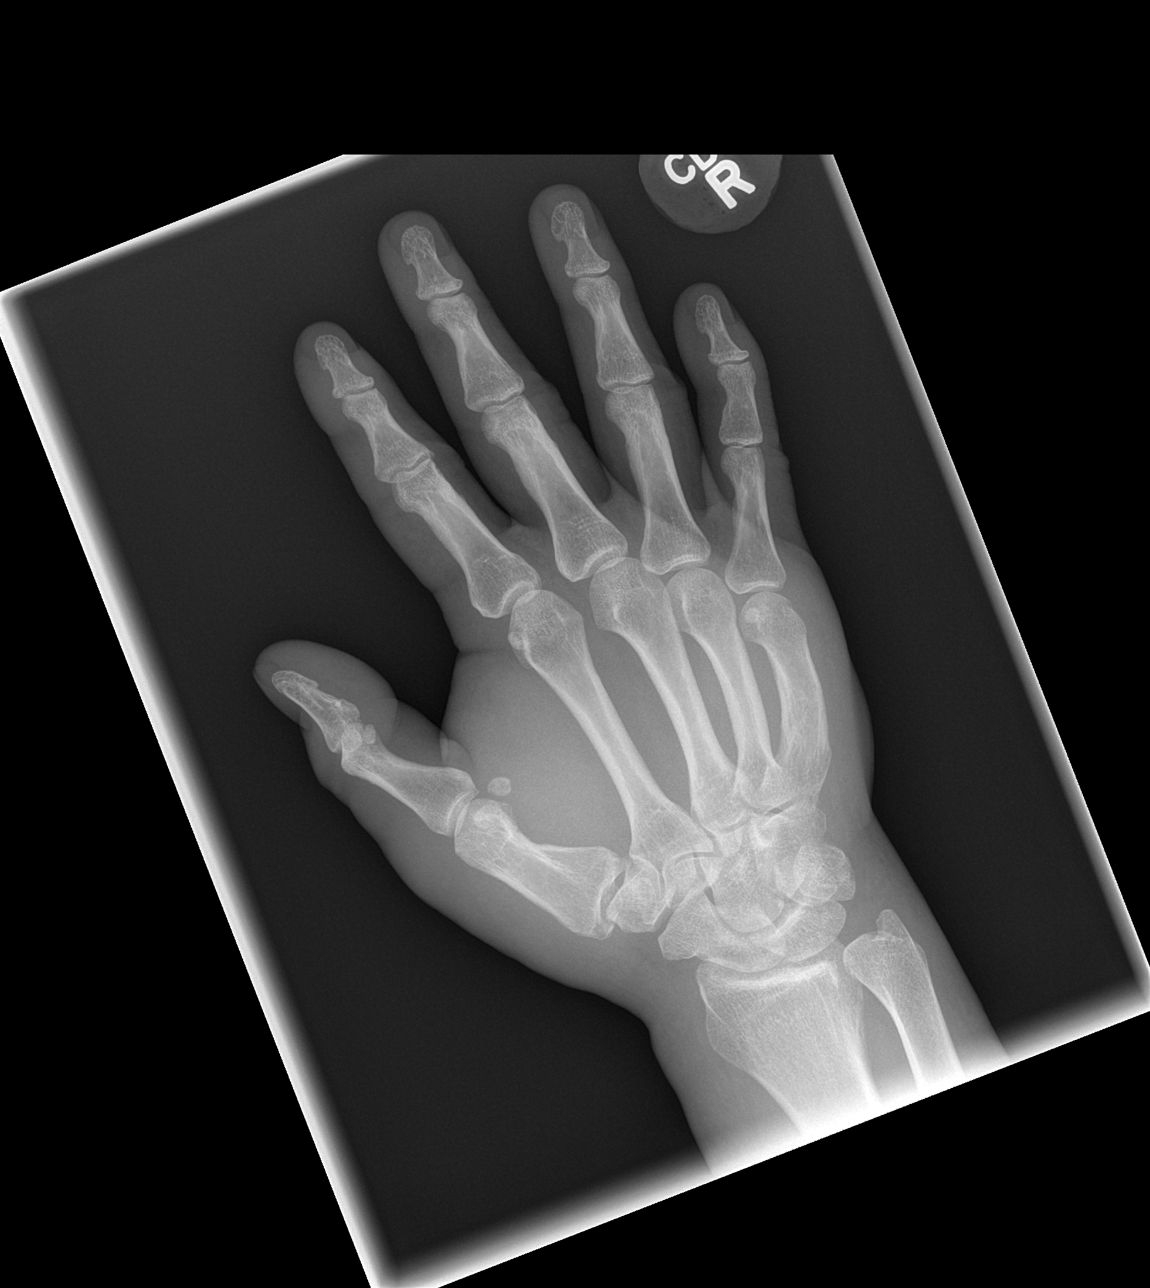

[x hand lat right]
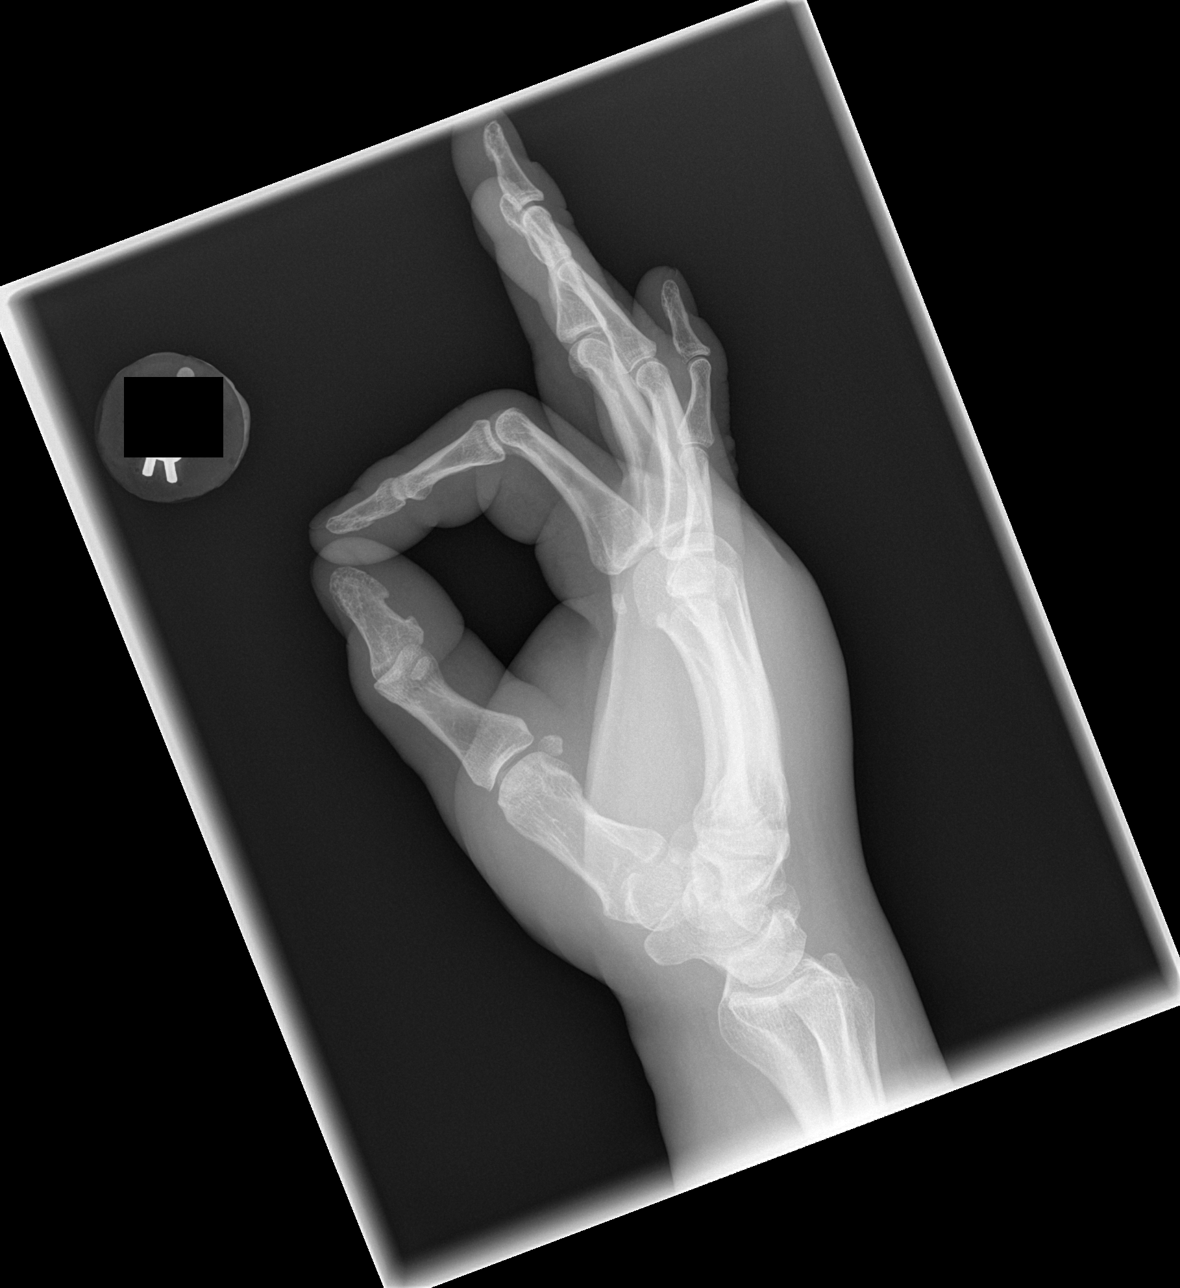

[3 of 3 positions shown; findings below may reference images not displayed]

FINDINGS: No acute fracture. Remote fifth metacarpal fracture. Normal
alignment and joint spaces. No periosteal reaction or bony
destructive change. Diffuse soft tissue edema with the dorsum of the
hand. No soft tissue air or radiopaque foreign body.
IMPRESSION: Dorsal soft tissue edema without acute osseous abnormality. No soft
tissue air or radiopaque foreign body.

## 2020-04-26 ENCOUNTER — Encounter (HOSPITAL_COMMUNITY): Payer: Self-pay | Admitting: Emergency Medicine

## 2020-04-26 ENCOUNTER — Other Ambulatory Visit: Payer: Self-pay

## 2020-04-26 ENCOUNTER — Emergency Department (HOSPITAL_COMMUNITY)
Admission: EM | Admit: 2020-04-26 | Discharge: 2020-04-26 | Disposition: A | Discharge status: Home / Self Care | Payer: Self-pay | Attending: Emergency Medicine | Admitting: Emergency Medicine

## 2020-04-26 DIAGNOSIS — Z5321 Procedure and treatment not carried out due to patient leaving prior to being seen by health care provider: Secondary | ICD-10-CM | POA: Insufficient documentation

## 2020-04-26 DIAGNOSIS — F22 Delusional disorders: Secondary | ICD-10-CM | POA: Insufficient documentation

## 2020-04-26 DIAGNOSIS — F419 Anxiety disorder, unspecified: Secondary | ICD-10-CM | POA: Insufficient documentation

## 2020-04-26 NOTE — ED Notes (Signed)
PT refused to get triage temp

## 2020-04-26 NOTE — ED Triage Notes (Signed)
Pt. Stated, Marcus Rice had in and out of anxiety, paranoia for the last 2 days.

## 2023-06-05 ENCOUNTER — Emergency Department (HOSPITAL_COMMUNITY)
Admission: EM | Admit: 2023-06-05 | Discharge: 2023-06-06 | Payer: BLUE CROSS/BLUE SHIELD | Attending: Emergency Medicine | Admitting: Emergency Medicine

## 2023-06-05 ENCOUNTER — Emergency Department (HOSPITAL_COMMUNITY): Payer: BLUE CROSS/BLUE SHIELD

## 2023-06-05 ENCOUNTER — Encounter (HOSPITAL_COMMUNITY): Payer: Self-pay

## 2023-06-05 DIAGNOSIS — T148XXA Other injury of unspecified body region, initial encounter: Secondary | ICD-10-CM

## 2023-06-05 DIAGNOSIS — Z23 Encounter for immunization: Secondary | ICD-10-CM | POA: Insufficient documentation

## 2023-06-05 DIAGNOSIS — S52571A Other intraarticular fracture of lower end of right radius, initial encounter for closed fracture: Secondary | ICD-10-CM | POA: Diagnosis not present

## 2023-06-05 DIAGNOSIS — Y9302 Activity, running: Secondary | ICD-10-CM | POA: Insufficient documentation

## 2023-06-05 DIAGNOSIS — M79601 Pain in right arm: Secondary | ICD-10-CM | POA: Diagnosis not present

## 2023-06-05 DIAGNOSIS — W19XXXA Unspecified fall, initial encounter: Secondary | ICD-10-CM | POA: Diagnosis not present

## 2023-06-05 DIAGNOSIS — S6991XA Unspecified injury of right wrist, hand and finger(s), initial encounter: Secondary | ICD-10-CM | POA: Diagnosis present

## 2023-06-05 LAB — CBC WITH DIFFERENTIAL/PLATELET
Abs Immature Granulocytes: 0.02 10*3/uL (ref 0.00–0.07)
Basophils Absolute: 0.1 10*3/uL (ref 0.0–0.1)
Basophils Relative: 1 %
Eosinophils Absolute: 0.1 10*3/uL (ref 0.0–0.5)
Eosinophils Relative: 1 %
HCT: 35.4 % — ABNORMAL LOW (ref 39.0–52.0)
Hemoglobin: 11.8 g/dL — ABNORMAL LOW (ref 13.0–17.0)
Immature Granulocytes: 0 %
Lymphocytes Relative: 14 %
Lymphs Abs: 1.3 10*3/uL (ref 0.7–4.0)
MCH: 30 pg (ref 26.0–34.0)
MCHC: 33.3 g/dL (ref 30.0–36.0)
MCV: 90.1 fL (ref 80.0–100.0)
Monocytes Absolute: 0.8 10*3/uL (ref 0.1–1.0)
Monocytes Relative: 9 %
Neutro Abs: 7.1 10*3/uL (ref 1.7–7.7)
Neutrophils Relative %: 75 %
Platelets: 210 10*3/uL (ref 150–400)
RBC: 3.93 MIL/uL — ABNORMAL LOW (ref 4.22–5.81)
RDW: 15 % (ref 11.5–15.5)
WBC: 9.3 10*3/uL (ref 4.0–10.5)
nRBC: 0 % (ref 0.0–0.2)

## 2023-06-05 LAB — COMPREHENSIVE METABOLIC PANEL
ALT: 28 U/L (ref 0–44)
AST: 59 U/L — ABNORMAL HIGH (ref 15–41)
Albumin: 4.1 g/dL (ref 3.5–5.0)
Alkaline Phosphatase: 77 U/L (ref 38–126)
Anion gap: 10 (ref 5–15)
BUN: 30 mg/dL — ABNORMAL HIGH (ref 6–20)
CO2: 26 mmol/L (ref 22–32)
Calcium: 9.5 mg/dL (ref 8.9–10.3)
Chloride: 103 mmol/L (ref 98–111)
Creatinine, Ser: 0.97 mg/dL (ref 0.61–1.24)
GFR, Estimated: >60 mL/min (ref >60)
Glucose, Bld: 96 mg/dL (ref 70–99)
Potassium: 3.5 mmol/L (ref 3.5–5.1)
Sodium: 139 mmol/L (ref 135–145)
Total Bilirubin: 0.6 mg/dL (ref 0.3–1.2)
Total Protein: 6.9 g/dL (ref 6.5–8.1)

## 2023-06-05 LAB — LACTIC ACID, PLASMA: Lactic Acid, Venous: 1.1 mmol/L (ref 0.5–1.9)

## 2023-06-05 MED ORDER — DOXYCYCLINE HYCLATE 100 MG PO TABS
100.0000 mg | ORAL_TABLET | Freq: Once | ORAL | Status: AC
  Administered 2023-06-06: 100 mg via ORAL
  Filled 2023-06-05: qty 1

## 2023-06-05 MED ORDER — TETANUS-DIPHTH-ACELL PERTUSSIS 5-2.5-18.5 LF-MCG/0.5 IM SUSY
0.5000 mL | PREFILLED_SYRINGE | Freq: Once | INTRAMUSCULAR | Status: AC
  Administered 2023-06-06: 0.5 mL via INTRAMUSCULAR
  Filled 2023-06-05: qty 0.5

## 2023-06-05 MED ORDER — OXYCODONE-ACETAMINOPHEN 5-325 MG PO TABS
1.0000 | ORAL_TABLET | Freq: Once | ORAL | Status: AC
  Administered 2023-06-06: 1 via ORAL
  Filled 2023-06-05: qty 1

## 2023-06-05 NOTE — ED Notes (Signed)
IV Team at bedside 

## 2023-06-05 NOTE — ED Notes (Signed)
Two more unsuccessful IV attempts by 2nd RN

## 2023-06-05 NOTE — ED Triage Notes (Addendum)
BIBA and in [employer] custody for right forearm pain after falling while running from the police. Pt has swelling in the right hand that has been there for about 4 days prior. 130/88 BP 96% room air 100 HR

## 2023-06-05 NOTE — ED Notes (Signed)
Unsuccessful IV attempt x2.  

## 2023-06-05 NOTE — ED Notes (Signed)
Unsuccessful IV attempt.

## 2023-06-06 MED ORDER — DOXYCYCLINE HYCLATE 100 MG PO CAPS: 100.0000 mg | ORAL_CAPSULE | Freq: Two times a day (BID) | ORAL | 0 refills | Status: AC

## 2023-06-06 NOTE — Progress Notes (Signed)
Orthopedic Tech Progress Note Patient Details:  Marcus Rice [birth year] [id number]  Ortho Devices Type of Ortho Device: Arm sling, Sugartong splint Ortho Device/Splint Location: rue Ortho Device/Splint Interventions: Ordered, Application, Adjustment   Post Interventions Patient Tolerated: Well Instructions Provided: Care of device, Adjustment of device  Trinna Post 06/06/2023, 3:39 AM

## 2023-06-06 NOTE — ED Notes (Signed)
Pt attempted to give urine but quantity sample not enough.Will continue to attempt to collect.
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
## 2023-06-06 NOTE — Discharge Instructions (Addendum)
You were seen in the ER for evaluation of your wrist pain. You have a fracture that we have splinted. You will need to see hand surgery as this may need surgery. I have listed the information for Dr. Yehuda Budd into the discharge paperwork for you to call to schedule an appointment. Additionally, I am going to prescribe you an antibiotic for you to take daily for a week. Please take the entire antibiotic and do not miss a dose.  For pain, you should take your Suboxone which should help.  Please make sure you are elevating your arm above your heart level to help with the pain and with the swelling.  If you have any worsening pain, changes to the color of your fingers, fever, increasing swelling, please return to your nurse present for evaluation.  Otherwise, you will follow-up with hand surgery. If you have any concerns, new or worsening symptoms, please return to the nearest ER for re-evaluation.   Contact a doctor if: Your cast, splint, or sling is damaged or loose. You have any new pain, swelling, or bruising. Your pain, swelling, and bruising do not get better. You have a fever. You have chills. Get help right away if: Your skin or fingers on your injured arm turn blue or gray. Your arm feels cold or gets numb. You have very bad pain in your injured wrist.

## 2023-06-10 LAB — CULTURE, BLOOD (ROUTINE X 2)
Culture: NO GROWTH
Special Requests: ADEQUATE

## 2023-08-20 ENCOUNTER — Encounter (HOSPITAL_COMMUNITY): Payer: Self-pay

## 2023-08-20 ENCOUNTER — Emergency Department (HOSPITAL_COMMUNITY)
Admission: EM | Admit: 2023-08-20 | Discharge: 2023-08-20 | Disposition: A | Discharge status: Home / Self Care | Payer: BLUE CROSS/BLUE SHIELD | Attending: Student | Admitting: Student

## 2023-08-20 ENCOUNTER — Other Ambulatory Visit: Payer: Self-pay

## 2023-08-20 DIAGNOSIS — Z77098 Contact with and (suspected) exposure to other hazardous, chiefly nonmedicinal, chemicals: Secondary | ICD-10-CM | POA: Diagnosis not present

## 2023-08-20 DIAGNOSIS — Z4689 Encounter for fitting and adjustment of other specified devices: Secondary | ICD-10-CM | POA: Diagnosis present

## 2023-08-20 DIAGNOSIS — Z4789 Encounter for other orthopedic aftercare: Secondary | ICD-10-CM

## 2023-08-20 NOTE — Progress Notes (Signed)
Orthopedic Tech Progress Note Patient Details:  LARONE MCGONAGLE December 03, 1975 [id number]  Ortho Devices Type of Ortho Device: Short arm splint Ortho Device/Splint Location: RUE Ortho Device/Splint Interventions: Ordered, Application, Adjustment   Post Interventions Patient Tolerated: Well Instructions Provided: Care of device  Tonye Pearson 08/20/2023, 8:20 AM

## 2023-08-20 NOTE — ED Provider Notes (Signed)
.  Ortho Injury Treatment  Date/Time: 08/20/2023 7:00 PM  Performed by: Glendora Score, MD Authorized by: Glendora Score, MD   Consent:    Consent obtained:  Verbal   Consent given by:  Patient   Risks discussed:  Fracture, nerve damage, restricted joint movement and vascular damage   Alternatives discussed:  No treatment and alternative treatmentInjury location: wrist Location details: right wrist Pre-procedure distal perfusion: normal Pre-procedure neurological function: normal Pre-procedure range of motion: reduced Immobilization: splint Splint type: sugar tong Splint Applied by: Milon Dikes Post-procedure distal perfusion: normal Post-procedure neurological function: normal Post-procedure range of motion: unchanged       Glendora Score, MD 08/20/23 1901
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
## 2023-08-20 NOTE — Discharge Instructions (Addendum)
Please make a visit with your orthopedic doctor soon as possible for reevaluation if your right wrist fracture.

## 2023-08-20 NOTE — ED Provider Notes (Signed)
[place] EMERGENCY DEPARTMENT AT [facility] Provider Note   CSN: [id number] Arrival date & time: 08/20/23  6962     History  Chief Complaint  Patient presents with   Chemical Exposure    TALVIN BOREY is a 47 y.o. male.  47 year old male with history of distal radius fracture in September presenting here for concerns of chemical exposure.  Patient states that he was working on a generator and fell.  He got gasoline down into his cast.  He states that he felt heat over his proximal palm and wrist underneath the cast.  He has not had a burning sensation following that.  Incident occurred approximately 4 to 5 hours ago.  Patient has been following up in orthopedic clinic.  He states his last appointment was about 3.5 weeks ago.  They recommended continued immobilization in a cast and repeat follow-up due to fracture instability.  Patient states that he has been having more pain in his right wrist.  He has been using his right hand frequently.  Denies any new numbness in his fingers or hand.  Does endorse persistent numbness in the tip of the right index finger since the injury.  The history is provided by the patient and medical records.       Home Medications Prior to Admission medications   Medication Sig Start Date End Date Taking? Authorizing Provider  acetaminophen (TYLENOL) 325 MG tablet Take 2 tablets (650 mg total) by mouth every 6 (six) hours as needed for mild pain (or Fever >/= 101). Patient not taking: Reported on 10/22/2016 04/19/14   Esperanza Sheets, MD  cephALEXin (KEFLEX) 500 MG capsule Take 1 capsule (500 mg total) by mouth 4 (four) times daily. Patient not taking: Reported on 08/17/2016 04/19/14   Esperanza Sheets, MD  doxycycline (VIBRAMYCIN) 100 MG capsule Take 1 capsule (100 mg total) by mouth 2 (two) times daily. 06/06/23   Achille Rich, PA-C  etodolac (LODINE) 300 MG capsule Take 1 capsule (300 mg total) by mouth every 8 (eight) hours. Patient not  taking: Reported on 08/17/2016 08/12/16   Linwood Dibbles, MD  ibuprofen (ADVIL,MOTRIN) 800 MG tablet Take 1 tablet (800 mg total) by mouth every 8 (eight) hours as needed for mild pain or moderate pain. Patient not taking: Reported on 10/22/2016 05/19/16   Trixie Dredge, PA-C  methocarbamol (ROBAXIN) 500 MG tablet Take 1-2 tablets (500-1,000 mg total) by mouth every 8 (eight) hours as needed for muscle spasms (and pain). Patient not taking: Reported on 08/17/2016 05/19/16   Trixie Dredge, PA-C  nicotine (NICODERM CQ - DOSED IN MG/24 HOURS) 21 mg/24hr patch Place 1 patch (21 mg total) onto the skin daily. Patient not taking: Reported on 08/17/2016 04/19/14   Esperanza Sheets, MD      Allergies    Patient has no known allergies.    Review of Systems   As noted in HPI  Physical Exam Updated Vital Signs BP 111/86 (BP Location: Left Arm)   Pulse 82   Temp 98.1 F (36.7 C)   Resp 16   Ht 5\' 11"  (1.803 m)   Wt 72.6 kg   SpO2 96%   BMI 22.32 kg/m  Physical Exam Vitals reviewed.  Constitutional:      General: He is not in acute distress.    Appearance: He is normal weight. He is not ill-appearing, toxic-appearing or diaphoretic.  Cardiovascular:     Rate and Rhythm: Normal rate and regular rhythm.  Heart sounds: Normal heart sounds. No murmur heard.    No friction rub. No gallop.  Pulmonary:     Effort: Pulmonary effort is normal. No respiratory distress.     Breath sounds: Normal breath sounds. No wheezing, rhonchi or rales.  Musculoskeletal:     Comments: Short arm cast on distal right upper extremity.  Brisk capillary refill in all 5 fingers right hand.  Sensation intact all 5 fingers with the exception of subjective decrease sensation at the distal tip of the right index finger.  Full active ROM at all joints in the fingers of the right hand.  Skin:    General: Skin is warm and dry.     Findings: Erythema (On the palmar and dorsal side of the distal right forearm) present.   Neurological:     Mental Status: He is alert.        ED Results / Procedures / Treatments   Labs (all labs ordered are listed, but only abnormal results are displayed) Labs Reviewed - No data to display  EKG None  Radiology No results found.  Procedures Procedures    Medications Ordered in ED Medications - No data to display  ED Course/ Medical Decision Making/ A&P                                 Medical Decision Making  47 year old male presents here for concerns of chemical exposure.  Has a short arm cast on the right wrist that he is concerned was contaminated with gasoline.  Vitals reassuring.  Right hand neurovascularly intact.  Will plan to remove cast to fully evaluate skin for potential chemical burn.  [facility] consulted for cast removal.  Will consider obtaining imaging once full exam after cast removal.  Cast removed at bedside by Ortho tech.  Skin exam performed.  Some superficial erythema noted as pictured above.  This seems more consistent with irritation from the patient's cast rather than chemical burn.  Area is not tender to palpation, which would be less consistent with burn.  Patient thoroughly irrigated the area with soap and water.  Volar splint placed by [facility].  Patient maintains full active ROM of all joints of the fingers of the right hand.  Patient felt to be appropriate for discharge at this time.  He was instructed to make a follow-up appointment with his orthopedic doctor as soon as possible to determine whether he needs replacement cast.  Return precautions discussed with patient.  Discharged in stable condition.  Patient's presentation is most consistent with acute, uncomplicated illness.         Final Clinical Impression(s) / ED Diagnoses Final diagnoses:  Chemical exposure  Aftercare for cast or splint check or change    Rx / DC Orders ED Discharge Orders     None         Rolla Flatten, MD 08/20/23 [id number]    Glendora Score, MD 08/20/23 1900
